# Patient Record
Sex: Female | Born: 1970 | Race: White | Hispanic: No | Marital: Married | Smoking: Current every day smoker
Health system: Southern US, Community
[De-identification: ages and names within clinical notes are randomized; demographics above are authoritative.]

## PROBLEM LIST (undated history)

## (undated) DIAGNOSIS — Z952 Presence of prosthetic heart valve: Secondary | ICD-10-CM

## (undated) DIAGNOSIS — Z8679 Personal history of other diseases of the circulatory system: Secondary | ICD-10-CM

## (undated) DIAGNOSIS — E785 Hyperlipidemia, unspecified: Secondary | ICD-10-CM

## (undated) DIAGNOSIS — F419 Anxiety disorder, unspecified: Secondary | ICD-10-CM

## (undated) HISTORY — PX: CARDIAC VALVE SURGERY: SHX40

---

## 2016-07-25 ENCOUNTER — Inpatient Hospital Stay
Admission: EM | Admit: 2016-07-25 | Discharge: 2016-08-01 | DRG: 897 | Disposition: A | Discharge status: Home / Self Care | Payer: Self-pay | Attending: Internal Medicine | Admitting: Internal Medicine

## 2016-07-25 ENCOUNTER — Emergency Department: Payer: Self-pay

## 2016-07-25 ENCOUNTER — Encounter: Payer: Self-pay | Admitting: Emergency Medicine

## 2016-07-25 DIAGNOSIS — F13239 Sedative, hypnotic or anxiolytic dependence with withdrawal, unspecified: Principal | ICD-10-CM | POA: Diagnosis present

## 2016-07-25 DIAGNOSIS — Z7901 Long term (current) use of anticoagulants: Secondary | ICD-10-CM

## 2016-07-25 DIAGNOSIS — Z8673 Personal history of transient ischemic attack (TIA), and cerebral infarction without residual deficits: Secondary | ICD-10-CM

## 2016-07-25 DIAGNOSIS — E876 Hypokalemia: Secondary | ICD-10-CM | POA: Diagnosis present

## 2016-07-25 DIAGNOSIS — F1110 Opioid abuse, uncomplicated: Secondary | ICD-10-CM | POA: Diagnosis present

## 2016-07-25 DIAGNOSIS — Z833 Family history of diabetes mellitus: Secondary | ICD-10-CM

## 2016-07-25 DIAGNOSIS — I639 Cerebral infarction, unspecified: Secondary | ICD-10-CM

## 2016-07-25 DIAGNOSIS — G9389 Other specified disorders of brain: Secondary | ICD-10-CM

## 2016-07-25 DIAGNOSIS — R410 Disorientation, unspecified: Secondary | ICD-10-CM | POA: Diagnosis present

## 2016-07-25 DIAGNOSIS — G939 Disorder of brain, unspecified: Secondary | ICD-10-CM | POA: Diagnosis present

## 2016-07-25 DIAGNOSIS — Z79899 Other long term (current) drug therapy: Secondary | ICD-10-CM

## 2016-07-25 DIAGNOSIS — F17210 Nicotine dependence, cigarettes, uncomplicated: Secondary | ICD-10-CM | POA: Diagnosis present

## 2016-07-25 DIAGNOSIS — Z952 Presence of prosthetic heart valve: Secondary | ICD-10-CM

## 2016-07-25 DIAGNOSIS — F419 Anxiety disorder, unspecified: Secondary | ICD-10-CM | POA: Diagnosis present

## 2016-07-25 DIAGNOSIS — Z8249 Family history of ischemic heart disease and other diseases of the circulatory system: Secondary | ICD-10-CM

## 2016-07-25 DIAGNOSIS — R451 Restlessness and agitation: Secondary | ICD-10-CM | POA: Diagnosis present

## 2016-07-25 DIAGNOSIS — R4182 Altered mental status, unspecified: Secondary | ICD-10-CM

## 2016-07-25 DIAGNOSIS — Z9114 Patient's other noncompliance with medication regimen: Secondary | ICD-10-CM

## 2016-07-25 DIAGNOSIS — G8929 Other chronic pain: Secondary | ICD-10-CM | POA: Diagnosis present

## 2016-07-25 DIAGNOSIS — Z8679 Personal history of other diseases of the circulatory system: Secondary | ICD-10-CM

## 2016-07-25 HISTORY — DX: Hyperlipidemia, unspecified: E78.5

## 2016-07-25 HISTORY — DX: Anxiety disorder, unspecified: F41.9

## 2016-07-25 HISTORY — DX: Presence of prosthetic heart valve: Z95.2

## 2016-07-25 HISTORY — DX: Personal history of other diseases of the circulatory system: Z86.79

## 2016-07-25 LAB — URINE DRUG SCREEN, QUALITATIVE (ARMC ONLY)
Amphetamines, Ur Screen: NOT DETECTED
Barbiturates, Ur Screen: NOT DETECTED
Benzodiazepine, Ur Scrn: POSITIVE — AB
Cannabinoid 50 Ng, Ur ~~LOC~~: NOT DETECTED
Cocaine Metabolite,Ur ~~LOC~~: NOT DETECTED
MDMA (Ecstasy)Ur Screen: NOT DETECTED
Methadone Scn, Ur: NOT DETECTED
Opiate, Ur Screen: NOT DETECTED
Phencyclidine (PCP) Ur S: NOT DETECTED
Tricyclic, Ur Screen: NOT DETECTED

## 2016-07-25 LAB — CBC WITH DIFFERENTIAL/PLATELET
BASOS ABS: 0 10*3/uL (ref 0–0.1)
BASOS PCT: 1 %
EOS ABS: 0.1 10*3/uL (ref 0–0.7)
Eosinophils Relative: 1 %
HEMATOCRIT: 32.3 % — AB (ref 35.0–47.0)
HEMOGLOBIN: 10.6 g/dL — AB (ref 12.0–16.0)
Lymphocytes Relative: 37 %
Lymphs Abs: 2.7 10*3/uL (ref 1.0–3.6)
MCH: 25.8 pg — ABNORMAL LOW (ref 26.0–34.0)
MCHC: 32.7 g/dL (ref 32.0–36.0)
MCV: 78.9 fL — ABNORMAL LOW (ref 80.0–100.0)
Monocytes Absolute: 0.7 10*3/uL (ref 0.2–0.9)
Monocytes Relative: 10 %
NEUTROS PCT: 53 %
Neutro Abs: 3.9 10*3/uL (ref 1.4–6.5)
Platelets: 325 10*3/uL (ref 150–440)
RBC: 4.1 MIL/uL (ref 3.80–5.20)
RDW: 18.8 % — AB (ref 11.5–14.5)
WBC: 7.5 10*3/uL (ref 3.6–11.0)

## 2016-07-25 LAB — POCT PREGNANCY, URINE: PREG TEST UR: NEGATIVE

## 2016-07-25 LAB — PROTIME-INR
INR: 1.82
PROTHROMBIN TIME: 21.3 s — AB (ref 11.4–15.2)

## 2016-07-25 LAB — LIPASE, BLOOD: LIPASE: 65 U/L — AB (ref 11–51)

## 2016-07-25 LAB — URINALYSIS COMPLETE WITH MICROSCOPIC (ARMC ONLY)
Bilirubin Urine: NEGATIVE
Glucose, UA: NEGATIVE mg/dL
Hgb urine dipstick: NEGATIVE
Ketones, ur: NEGATIVE mg/dL
Leukocytes, UA: NEGATIVE
Nitrite: NEGATIVE
Protein, ur: NEGATIVE mg/dL
RBC / HPF: NONE SEEN RBC/hpf (ref 0–5)
Specific Gravity, Urine: 1.006 (ref 1.005–1.030)
pH: 8 (ref 5.0–8.0)

## 2016-07-25 LAB — SALICYLATE LEVEL

## 2016-07-25 LAB — LACTIC ACID, PLASMA
LACTIC ACID, VENOUS: 2 mmol/L — AB (ref 0.5–1.9)
LACTIC ACID, VENOUS: 1.9 mmol/L (ref 0.5–1.9)

## 2016-07-25 LAB — TROPONIN I: TROPONIN I: 0.06 ng/mL — AB (ref <0.03)

## 2016-07-25 LAB — COMPREHENSIVE METABOLIC PANEL
ALK PHOS: 109 U/L (ref 38–126)
ALT: 28 U/L (ref 14–54)
ANION GAP: 10 (ref 5–15)
AST: 52 U/L — ABNORMAL HIGH (ref 15–41)
Albumin: 4.2 g/dL (ref 3.5–5.0)
BILIRUBIN TOTAL: 0.5 mg/dL (ref 0.3–1.2)
BUN: 19 mg/dL (ref 6–20)
CALCIUM: 9.6 mg/dL (ref 8.9–10.3)
CO2: 23 mmol/L (ref 22–32)
Chloride: 103 mmol/L (ref 101–111)
Creatinine, Ser: 0.84 mg/dL (ref 0.44–1.00)
GFR calc non Af Amer: >60 mL/min (ref >60)
Glucose, Bld: 92 mg/dL (ref 65–99)
Potassium: 3.8 mmol/L (ref 3.5–5.1)
SODIUM: 136 mmol/L (ref 135–145)
TOTAL PROTEIN: 8.2 g/dL — AB (ref 6.5–8.1)

## 2016-07-25 LAB — AMMONIA: Ammonia: 13 umol/L (ref 9–35)

## 2016-07-25 LAB — ETHANOL: Alcohol, Ethyl (B): <5 mg/dL

## 2016-07-25 LAB — ACETAMINOPHEN LEVEL: Acetaminophen (Tylenol), Serum: <10 ug/mL — ABNORMAL LOW (ref 10–30)

## 2016-07-25 MED ORDER — LORAZEPAM 2 MG/ML IJ SOLN: 0.0000 mg | Freq: Two times a day (BID) | INTRAMUSCULAR | Status: DC

## 2016-07-25 MED ORDER — PIPERACILLIN-TAZOBACTAM 4.5 G IVPB
4.5000 g | Freq: Three times a day (TID) | INTRAVENOUS | Status: DC
  Filled 2016-07-25 (×3): qty 100

## 2016-07-25 MED ORDER — MORPHINE SULFATE (PF) 2 MG/ML IV SOLN
2.0000 mg | INTRAVENOUS | Status: DC | PRN
  Administered 2016-07-26 – 2016-08-01 (×18): 2 mg via INTRAVENOUS
  Filled 2016-07-25 (×18): qty 1

## 2016-07-25 MED ORDER — VITAMIN B-1 100 MG PO TABS
100.0000 mg | ORAL_TABLET | Freq: Every day | ORAL | Status: DC
  Administered 2016-07-26 – 2016-08-01 (×6): 100 mg via ORAL
  Filled 2016-07-25 (×6): qty 1

## 2016-07-25 MED ORDER — HEPARIN (PORCINE) IN NACL 100-0.45 UNIT/ML-% IJ SOLN
1100.0000 [IU]/h | INTRAMUSCULAR | Status: DC
  Administered 2016-07-26: 900 [IU]/h via INTRAVENOUS
  Administered 2016-07-27: 1100 [IU]/h via INTRAVENOUS
  Filled 2016-07-25 (×2): qty 250

## 2016-07-25 MED ORDER — SODIUM CHLORIDE 0.9% FLUSH
3.0000 mL | Freq: Two times a day (BID) | INTRAVENOUS | Status: DC
  Administered 2016-07-25 – 2016-07-30 (×8): 3 mL via INTRAVENOUS

## 2016-07-25 MED ORDER — PNEUMOCOCCAL VAC POLYVALENT 25 MCG/0.5ML IJ INJ
0.5000 mL | INJECTION | INTRAMUSCULAR | Status: AC
  Administered 2016-07-26: 0.5 mL via INTRAMUSCULAR
  Filled 2016-07-25: qty 0.5

## 2016-07-25 MED ORDER — FLUCONAZOLE IN SODIUM CHLORIDE 400-0.9 MG/200ML-% IV SOLN
800.0000 mg | Freq: Once | INTRAVENOUS | Status: AC
  Administered 2016-07-26: 800 mg via INTRAVENOUS
  Filled 2016-07-25: qty 400

## 2016-07-25 MED ORDER — DULOXETINE HCL 30 MG PO CPEP
30.0000 mg | ORAL_CAPSULE | Freq: Every day | ORAL | Status: DC
  Administered 2016-07-26 – 2016-08-01 (×6): 30 mg via ORAL
  Filled 2016-07-25 (×6): qty 1

## 2016-07-25 MED ORDER — ONDANSETRON HCL 4 MG/2ML IJ SOLN: 4.0000 mg | Freq: Four times a day (QID) | INTRAMUSCULAR | Status: DC | PRN

## 2016-07-25 MED ORDER — ONDANSETRON HCL 4 MG PO TABS: 4.0000 mg | ORAL_TABLET | Freq: Four times a day (QID) | ORAL | Status: DC | PRN

## 2016-07-25 MED ORDER — HEPARIN BOLUS VIA INFUSION
3000.0000 [IU] | Freq: Once | INTRAVENOUS | Status: AC
  Administered 2016-07-26: 02:00:00 3000 [IU] via INTRAVENOUS
  Filled 2016-07-25: qty 3000

## 2016-07-25 MED ORDER — LORAZEPAM 2 MG/ML IJ SOLN
0.0000 mg | Freq: Four times a day (QID) | INTRAMUSCULAR | Status: DC
  Administered 2016-07-26: 2 mg via INTRAVENOUS
  Administered 2016-07-26 (×2): 1 mg via INTRAVENOUS
  Filled 2016-07-25: qty 1

## 2016-07-25 MED ORDER — ADULT MULTIVITAMIN W/MINERALS CH
1.0000 | ORAL_TABLET | Freq: Every day | ORAL | Status: DC
Start: 2016-07-26 — End: 2016-08-01
  Administered 2016-07-26 – 2016-08-01 (×6): 1 via ORAL
  Filled 2016-07-25 (×6): qty 1

## 2016-07-25 MED ORDER — LORAZEPAM 1 MG PO TABS: 1.0000 mg | ORAL_TABLET | Freq: Four times a day (QID) | ORAL | Status: DC | PRN

## 2016-07-25 MED ORDER — VANCOMYCIN HCL IN DEXTROSE 1-5 GM/200ML-% IV SOLN
1000.0000 mg | Freq: Once | INTRAVENOUS | Status: AC
  Administered 2016-07-26: 02:00:00 1000 mg via INTRAVENOUS
  Filled 2016-07-25: qty 200

## 2016-07-25 MED ORDER — MORPHINE SULFATE (PF) 2 MG/ML IV SOLN
2.0000 mg | Freq: Once | INTRAVENOUS | Status: AC
  Administered 2016-07-25: 2 mg via INTRAVENOUS
  Filled 2016-07-25: qty 1

## 2016-07-25 MED ORDER — ACETAMINOPHEN 650 MG RE SUPP: 650.0000 mg | Freq: Four times a day (QID) | RECTAL | Status: DC | PRN

## 2016-07-25 MED ORDER — ACETAMINOPHEN 325 MG PO TABS
650.0000 mg | ORAL_TABLET | Freq: Four times a day (QID) | ORAL | Status: DC | PRN
  Administered 2016-07-27 – 2016-07-28 (×2): 650 mg via ORAL
  Filled 2016-07-25 (×2): qty 2

## 2016-07-25 MED ORDER — SODIUM CHLORIDE 0.9 % IV SOLN
INTRAVENOUS | Status: DC
  Administered 2016-07-25: via INTRAVENOUS

## 2016-07-25 MED ORDER — LORAZEPAM 2 MG/ML IJ SOLN
1.0000 mg | Freq: Once | INTRAMUSCULAR | Status: AC
  Administered 2016-07-25: 1 mg via INTRAVENOUS
  Filled 2016-07-25: qty 1

## 2016-07-25 MED ORDER — THIAMINE HCL 100 MG/ML IJ SOLN
100.0000 mg | Freq: Every day | INTRAMUSCULAR | Status: DC
  Administered 2016-07-27: 100 mg via INTRAVENOUS
  Filled 2016-07-25: qty 2

## 2016-07-25 MED ORDER — LORAZEPAM 2 MG/ML IJ SOLN
1.0000 mg | Freq: Four times a day (QID) | INTRAMUSCULAR | Status: DC | PRN
  Filled 2016-07-25 (×2): qty 1

## 2016-07-25 MED ORDER — FOLIC ACID 1 MG PO TABS
1.0000 mg | ORAL_TABLET | Freq: Every day | ORAL | Status: DC
  Administered 2016-07-26 – 2016-08-01 (×6): 1 mg via ORAL
  Filled 2016-07-25 (×6): qty 1

## 2016-07-25 NOTE — ED Notes (Signed)
Put pt on bed pan

## 2016-07-25 NOTE — ED Triage Notes (Signed)
Pt had recent mitral valve replacement and started on coumadin. Pt's husband reports pt was recently started on antibiotic and pt has had increased confusion , hallucinations, and slightly combative. Pt reports vision changes, able to answer some questions, has involuntary muscle jerking.

## 2016-07-25 NOTE — Consult Note (Signed)
ID E note Spoke with ED and admitting hospitalist.  Unclear etiology of her AMS but she appears to be off of her fluconazole. Last saw ID July 25th- see note below copied from them.   Rec Repeat cxs Start vanco, ceftriaxone in case she has resume injecting and has a new infection. Restart fluconazole Consult Neuro       July 25th ID note- [facility] [place] Rachel Morrison presents for follow-up. 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 Since I had last seen her in clinic she was briefly admitted to the hospital due to a soft tissue infection of her sternal incision it cultured positive for MRSA. She was treated with clindamycin. The incision has now completely healed with no tenderness or drainage. She was seen by cardiothoracic surgery and it was felt that there was no sternal infection. I agree with this.  As of today she informs me that she is moving to [place]. [facility] has arranged for ongoing follow-up with Dr. Juliann Pares. I will send my note from today to him.  She has had difficulties with Coumadin, at times with elevated INR and at times with subtherapeutic. We certainly note the drug interaction between fluconazole and Coumadin, but ongoing fluconazole treatment is essential for her health in life. I would expect that when she has a titrated Coumadin dose (which we would expect to be a low-dose given drug interactions) this should be without difficulty.  She informs me that she has not used any injecting drugs. She has old track marks on her arms. In my opinion, there should be ongoing concern for risk of polysubstance abuse.  During her initial hospitalization in May she had persistent candidemia with Candida parapsilosis. She was initially treated with Micafungin and did then require mitral valve  replacement. During her initial illness she had altered sensation on the left side of her body. She had an MRI of the brain that was felt to not show any stroke. She was seen by Neurology. She had an MRI of her cervical spine which also did not show any abnormalities. 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 MRI of the lumbar spine at that point showed some L3-L4 involvement and also involvement of the left sacral ala. Also during that time there was some evidence of left distal popliteal artery occlusion with distal reconstitution. 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  As of today she looks fairly comfortable. She still feels that she has some altered sensation. MRI is not possible with her mechanical valve. She is relatively improved compared to before. She feels that she has some altered peripheral vision on her left eye. I have advised her that she should seek ophthalmology evaluation. She will need ongoing infectious diseases follow-up as well. She is known to [place] at this point I have suggested ongoing follow-up with me. If there are difficulties with travel we could facilitate follow-up with infectious diseases in [place] or at [place] [facility] [place].  She has had an extremely complex disease process. I am readily available to discuss with any of her future treating physicians. We plan on continuing fluconazole for at least 6 months, if not lifelong. It should be noted that there was not evidence of annular abscess at the time of her valve replacement, but she did have a large vegetation and had  complications of embolic events and metastatic infection, as described above. She still has back pain. She had chronic back pain even before the infection. In the future we would consider repeat inflammatory markers and CT scan of the spine to evaluate for the status of the previously identified left sacral ala infection.

## 2016-07-25 NOTE — H&P (Signed)
[facility] [place] [facility]   PATIENT NAME: Rachel Morrison Willmann    MR#:  [id number]  DATE OF BIRTH:  [birth year]  DATE OF ADMISSION:  07/25/2016  PRIMARY CARE PHYSICIAN: No PCP Per Patient   REQUESTING/REFERRING PHYSICIAN: Pershing ProudSchaevitz, MD  CHIEF COMPLAINT:   Chief Complaint  Patient presents with  . Altered Mental Status    HISTORY OF PRESENT ILLNESS:  Rachel Morrison Anand  is a 45 y.o. female who presents with Altered mental status and hallucinations. She was brought in by her significant other states that she tried to jump out of a car today because she saw some in the back seat that she thought was telling him to kill her. Patient states that she has had some other hallucinations recently. Sometimes she can tolerate hallucinations and other times not. 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 She's had difficult time maintaining therapeutic INR with several episodes of subtherapeutic INR. She denies any recent infectious symptoms. 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 Hospitalists were called for admission and further evaluation  PAST MEDICAL HISTORY:   Past Medical History:  Diagnosis Date  . Anxiety   . H/O endocarditis   . H/O mitral valve replacement   . HLD (hyperlipidemia)     PAST SURGICAL HISTORY:   Past Surgical History:  Procedure Laterality Date  . CARDIAC VALVE SURGERY      SOCIAL HISTORY:   Social History  Substance Use Topics  . Smoking status: Not on file  . Smokeless tobacco: Not on file  . Alcohol use Not on file    FAMILY HISTORY:   Family History  Problem Relation Age of Onset  . Diabetes Mother   . Sleep apnea Mother   . Heart attack Father    . Hypertension Father     DRUG ALLERGIES:  No Known Allergies  MEDICATIONS AT HOME:   Prior to Admission medications   Medication Sig Start Date End Date Taking? Authorizing Provider  ALPRAZolam Prudy Feeler(XANAX) 1 MG tablet Take 1 tablet by mouth at bedtime as needed. 07/01/16  Yes Historical Provider, MD  enoxaparin (LOVENOX) 60 MG/0.6ML injection Inject into the skin. 06/18/16  Yes Historical Provider, MD  DULoxetine (CYMBALTA) 30 MG capsule Take 1 capsule by mouth daily.    Historical Provider, MD  warfarin (COUMADIN) 2.5 MG tablet Take 1 tablet by mouth daily.    Historical Provider, MD    REVIEW OF SYSTEMS:  Review of Systems  Constitutional: Negative for chills, fever, malaise/fatigue and weight loss.  HENT: Negative for ear pain, hearing loss and tinnitus.   Eyes: Negative for blurred vision, double vision, pain and redness.  Respiratory: Negative for cough, hemoptysis and shortness of breath.   Cardiovascular: Negative for chest pain, palpitations, orthopnea and leg swelling.  Gastrointestinal: Negative for abdominal pain, constipation, diarrhea, nausea and vomiting.  Genitourinary: Negative for dysuria, frequency and hematuria.  Musculoskeletal: Negative for back pain, joint pain and neck pain.  Skin:       No acne, rash, or lesions  Neurological: Negative for dizziness, tremors, focal weakness and weakness.  Endo/Heme/Allergies: Negative for polydipsia. Does not bruise/bleed easily.  Psychiatric/Behavioral: Positive for hallucinations and memory loss. Negative for depression. The patient is not nervous/anxious  and does not have insomnia.        Confusion     VITAL SIGNS:   Vitals:   07/25/16 1758  BP: (!) 141/95  Pulse: 95  Resp: 16  Temp: 98 F (36.7 C)  TempSrc: Oral  SpO2: 95%  Weight: 52.6 kg (116 lb)   Wt Readings from Last 3 Encounters:  07/25/16 52.6 kg (116 lb)    PHYSICAL EXAMINATION:  Physical Exam  Vitals reviewed. Constitutional: She appears  well-developed and well-nourished. No distress.  HENT:  Head: Normocephalic and atraumatic.  Mouth/Throat: Oropharynx is clear and moist.  Eyes: Conjunctivae and EOM are normal. Pupils are equal, round, and reactive to light. No scleral icterus.  Neck: Normal range of motion. Neck supple. No JVD present. No thyromegaly present.  Cardiovascular: Normal rate, regular rhythm and intact distal pulses.  Exam reveals no gallop and no friction rub.   No murmur heard. Respiratory: Effort normal and breath sounds normal. No respiratory distress. She has no wheezes. She has no rales.  GI: Soft. Bowel sounds are normal. She exhibits no distension. There is no tenderness.  Musculoskeletal: Normal range of motion. She exhibits no edema.  No arthritis, no gout  Lymphadenopathy:    She has no cervical adenopathy.  Neurological: She is alert. No cranial nerve deficit.  Patient is oriented on questioning, but the course of her speech is oftentimes very distracted, tangential, or outright confused. She also has difficulty remembering some things, including things that happened today. No dysarthria, no aphasia  Skin: Skin is warm and dry. No rash noted. No erythema.  Psychiatric: She has a normal mood and affect.  Significant other reports erratic behavior, see history of present illness    LABORATORY PANEL:   CBC  Recent Labs Lab 07/25/16 1804  WBC 7.5  HGB 10.6*  HCT 32.3*  PLT 325   ------------------------------------------------------------------------------------------------------------------  Chemistries   Recent Labs Lab 07/25/16 1820  NA 136  K 3.8  CL 103  CO2 23  GLUCOSE 92  BUN 19  CREATININE 0.84  CALCIUM 9.6  AST 52*  ALT 28  ALKPHOS 109  BILITOT 0.5   ------------------------------------------------------------------------------------------------------------------  Cardiac Enzymes  Recent Labs Lab 07/25/16 1804  TROPONINI 0.06*    ------------------------------------------------------------------------------------------------------------------  RADIOLOGY:  Dg Chest 1 View  Result Date: 07/25/2016 CLINICAL DATA:  Altered mental status. Recent mitral valve replacement. EXAM: CHEST 1 VIEW COMPARISON:  None. FINDINGS: Sternotomy wires appear aligned and intact. Mitral valve prosthesis is in place. Normal heart size. Normal mediastinal contour. No pneumothorax. No pleural effusion. Lungs appear clear, with no acute consolidative airspace disease and no pulmonary edema. IMPRESSION: No active disease. Electronically Signed   By: Delbert Phenix M.D.   On: 07/25/2016 18:40   Ct Head Wo Contrast  Result Date: 07/25/2016 CLINICAL DATA:  Altered mental status. Anticoagulated on Coumadin for recent mitral valve replacement. Confusion and hallucinations. EXAM: CT HEAD WITHOUT CONTRAST TECHNIQUE: Contiguous axial images were obtained from the base of the skull through the vertex without intravenous contrast. COMPARISON:  None. FINDINGS: There is a large focus of encephalomalacia in the medial right occipital lobe with associated ex vacuo dilatation of the occipital horn of the right lateral ventricle. There is a small focus of encephalomalacia in the left superior cerebellar hemisphere. There is a hypoechoic focus in the right thalamus. No evidence of parenchymal hemorrhage or extra-axial fluid collection. No mass lesion, mass effect, or midline shift. Otherwise no ventriculomegaly. The visualized paranasal sinuses are essentially clear. The  mastoid air cells are unopacified. No evidence of calvarial fracture. IMPRESSION: 1. Large focus of encephalomalacia in the medial right occipital lobe with ex vacuo dilatation of the occipital horn of the right lateral ventricle, most suggestive of a chronic infarct. 2. Small focus of encephalomalacia in the left superior cerebellar hemisphere, most suggestive of a late subacute to chronic infarct. 3.  Hypoechoic focus in the right thalamus, suggesting an infarct of uncertain chronicity, probably subacute to chronic. Correlate with brain MRI as clinically warranted. 4. No acute intracranial hemorrhage. No significant mass effect or midline shift. Electronically Signed   By: Delbert Phenix M.D.   On: 07/25/2016 18:50    EKG:   Orders placed or performed during the hospital encounter of 07/25/16  . EKG 12-Lead  . EKG 12-Lead    IMPRESSION AND PLAN:  Principal Problem:   Altered mental status - suspect potentially related to the cause of her brain lesions. High suspicion for embolic phenomenon, very possibly related to subtherapeutic INR with Mechanical heart valve. We will have her on heparin tonight for anticoagulation, and consult cardiology. We have also consulted neurology. We started her on antibiotics for endocarditis including antifungal and consulted ID. She may very well end up needing a psychiatry consult as well depending on the results of the above workup. Active Problems:   Brain lesion - strong suspicion for possible embolic phenomenon, workup as above.   Anxiety - patient had benzo positive on her urine drug screen and apparently was on benzos, we have her on CIWA for any withdrawal phenomenon   H/O endocarditis - with mechanical mitral valve, see workup as above  All the records are reviewed and case discussed with ED provider. Management plans discussed with the patient and/or family.  DVT PROPHYLAXIS: Systemic anticoagulation  GI PROPHYLAXIS: None  ADMISSION STATUS: Inpatient  CODE STATUS: Full Code Status History    This patient does not have a recorded code status. Please follow your organizational policy for patients in this situation.      TOTAL TIME TAKING CARE OF THIS PATIENT: 45 minutes.    Graviel Payeur FIELDING 07/25/2016, 8:58 PM  [facility]  Office  [phone]  CC: Primary care physician; No PCP Per Patient

## 2016-07-25 NOTE — ED Provider Notes (Signed)
[facility] Emergency Department Provider Note   ____________________________________________   First MD Initiated Contact with Patient 07/25/16 1809     (approximate)  I have reviewed the triage vital signs and the nursing notes.   HISTORY  Chief Complaint Altered Mental Status    HPI Rachel Morrison is a 45 y.o. female who is presenting to the emergency department today with 2-3 days of altered mental status. She was brought in by her Cyndie Chimeguyen other after the altered mental status continued to progress. The patient has a history of endocarditis with mitral valve replacement this past May in [place]. 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 She is a former IV drug user. She denies any recent IV drug use and her significant other agrees. The patient denies any alcohol use. She says that she has pain to her left side and has had chronic pain and weakness to the side ever since she was hospitalized for endocarditis. Per the patient's significant other she has been taking her Coumadin but not her other medications which include fluconazole and Xanax.  Per the patient's significant other the patient has been hallucinating and has been trying to get out of the car while it has been moving.  I reviewed her record from her previous admission in May and she was found to have possible infection abutting the L4 vertebra which the patient as well as significant other say is the cause of the left-sided pain and weakness.    Past Medical History:  Diagnosis Date  . H/O mitral valve replacement     There are no active problems to display for this patient.   No past surgical history on file.  Prior to Admission medications   Medication Sig Start Date End Date Taking? Authorizing Provider  ALPRAZolam Prudy Feeler(XANAX) 1 MG tablet Take 1 tablet by mouth at bedtime as needed. 07/01/16  Yes  Historical Provider, MD  enoxaparin (LOVENOX) 60 MG/0.6ML injection Inject into the skin. 06/18/16  Yes Historical Provider, MD  DULoxetine (CYMBALTA) 30 MG capsule Take 1 capsule by mouth daily.    Historical Provider, MD  warfarin (COUMADIN) 2.5 MG tablet Take 1 tablet by mouth daily.    Historical Provider, MD    Allergies Review of patient's allergies indicates no known allergies.  No family history on file.  Social History Social History  Substance Use Topics  . Smoking status: Not on file  . Smokeless tobacco: Not on file  . Alcohol use Not on file    Review of Systems Constitutional: No fever/chills Eyes: No visual changes. ENT: No sore throat. Cardiovascular: Denies chest pain. Respiratory: Denies shortness of breath. Gastrointestinal: No abdominal pain.  No nausea, no vomiting.  No diarrhea.  No constipation. Genitourinary: Negative for dysuria. Musculoskeletal: Negative for back pain. Skin: Negative for rash. Neurological: Negative for headaches, focal weakness or numbness.  10-point ROS otherwise negative.  However, the review of systems seems to be confounded by the patient's altered mental status. She is only able to answer with limited detail.  ____________________________________________   PHYSICAL EXAM:  VITAL SIGNS: ED Triage Vitals [07/25/16 1758]  Enc Vitals Group     BP (!) 141/95     Pulse Rate 95     Resp 16     Temp 98 F (36.7 C)     Temp Source Oral     SpO2 95 %     Weight  116 lb (52.6 kg)     Height      Head Circumference      Peak Flow      Pain Score 10     Pain Loc      Pain Edu?      Excl. in GC?     Constitutional: Alert and oriented To self only. Odd, undulating motions while lying on the stretcher. Appears to occasionally stare into space but without any seizure activity and is redirectable. Eyes: Conjunctivae are normal. PERRL. EOMI. Head: Atraumatic. Nose: No congestion/rhinnorhea. Mouth/Throat: Mucous membranes are  moist.  Oropharynx non-erythematous. Neck: No stridor.   Cardiovascular: Normal rate, regular rhythm. Mechanical valve click without any obvious murmur.  Good peripheral circulation with intact in bilateral radial as well as dorsalis pedis pulses. Respiratory: Normal respiratory effort.  No retractions. Lungs CTAB. Gastrointestinal: Soft and nontender. No distention.  Musculoskeletal: No lower extremity tenderness nor edema.  No joint effusions. Neurologic:  Normal speech and language. 4-5 strength to the left lower extremity. Otherwise no focal neurologic findings. Skin:  Skin is warm, dry and intact. No rash noted. Psychiatric: Odd affect.  ____________________________________________   LABS (all labs ordered are listed, but only abnormal results are displayed)  Labs Reviewed  CBC WITH DIFFERENTIAL/PLATELET - Abnormal; Notable for the following:       Result Value   Hemoglobin 10.6 (*)    HCT 32.3 (*)    MCV 78.9 (*)    MCH 25.8 (*)    RDW 18.8 (*)    All other components within normal limits  TROPONIN I - Abnormal; Notable for the following:    Troponin I 0.06 (*)    All other components within normal limits  PROTIME-INR - Abnormal; Notable for the following:    Prothrombin Time 21.3 (*)    All other components within normal limits  URINALYSIS COMPLETEWITH MICROSCOPIC ([facility] ONLY) - Abnormal; Notable for the following:    Color, Urine STRAW (*)    APPearance CLEAR (*)    Bacteria, UA RARE (*)    Squamous Epithelial / LPF 0-5 (*)    All other components within normal limits  LACTIC ACID, PLASMA - Abnormal; Notable for the following:    Lactic Acid, Venous 2.0 (*)    All other components within normal limits  ACETAMINOPHEN LEVEL - Abnormal; Notable for the following:    Acetaminophen (Tylenol), Serum <10 (*)    All other components within normal limits  URINE DRUG SCREEN, QUALITATIVE ([facility] ONLY) - Abnormal; Notable for the following:    Benzodiazepine, Ur Scrn POSITIVE (*)     All other components within normal limits  COMPREHENSIVE METABOLIC PANEL - Abnormal; Notable for the following:    Total Protein 8.2 (*)    AST 52 (*)    All other components within normal limits  LIPASE, BLOOD - Abnormal; Notable for the following:    Lipase 65 (*)    All other components within normal limits  CULTURE, BLOOD (ROUTINE X 2)  CULTURE, BLOOD (ROUTINE X 2)  CULTURE, BLOOD (SINGLE)  ETHANOL  AMMONIA  SALICYLATE LEVEL  LACTIC ACID, PLASMA  POC URINE PREG, ED  POCT PREGNANCY, URINE   ____________________________________________  EKG  ED ECG REPORT I, Schaevitz,  Teena Irani, the attending physician, personally viewed and interpreted this ECG.   Date: 07/25/2016  EKG Time: 2018  Rate: 91  Rhythm: normal sinus rhythm  Axis: Normal  Intervals:none  ST&T Change: T wave inversions in aVL as well  as V2. No ST elevation or depression. No previous for comparison.  ____________________________________________  RADIOLOGY  CT Head Wo Contrast (Accession [id number]) (Order [id number])  Imaging  Date: 07/25/2016 Department: [facility] EMERGENCY DEPARTMENT Released By/Authorizing: Myrna Blazer, MD (auto-released)  PACS Images   Show images for CT Head Wo Contrast  Study Result   CLINICAL DATA:  Altered mental status. Anticoagulated on Coumadin for recent mitral valve replacement. Confusion and hallucinations.  EXAM: CT HEAD WITHOUT CONTRAST  TECHNIQUE: Contiguous axial images were obtained from the base of the skull through the vertex without intravenous contrast.  COMPARISON:  None.  FINDINGS: There is a large focus of encephalomalacia in the medial right occipital lobe with associated ex vacuo dilatation of the occipital horn of the right lateral ventricle. There is a small focus of encephalomalacia in the left superior cerebellar hemisphere. There is a hypoechoic focus in the right thalamus. No evidence of parenchymal  hemorrhage or extra-axial fluid collection. No mass lesion, mass effect, or midline shift. Otherwise no ventriculomegaly.  The visualized paranasal sinuses are essentially clear. The mastoid air cells are unopacified. No evidence of calvarial fracture.  IMPRESSION: 1. Large focus of encephalomalacia in the medial right occipital lobe with ex vacuo dilatation of the occipital horn of the right lateral ventricle, most suggestive of a chronic infarct. 2. Small focus of encephalomalacia in the left superior cerebellar hemisphere, most suggestive of a late subacute to chronic infarct. 3. Hypoechoic focus in the right thalamus, suggesting an infarct of uncertain chronicity, probably subacute to chronic. Correlate with brain MRI as clinically warranted. 4. No acute intracranial hemorrhage. No significant mass effect or midline shift.   Electronically Signed   By: Delbert Phenix M.D.   On: 07/25/2016 18:50    DG Chest 1 View (Accession [id number]) (Order [id number])  Imaging  Date: 07/25/2016 Department: [facility] EMERGENCY DEPARTMENT Released By/Authorizing: Myrna Blazer, MD (auto-released)  PACS Images   Show images for DG Chest 1 View  Study Result   CLINICAL DATA:  Altered mental status. Recent mitral valve replacement.  EXAM: CHEST 1 VIEW  COMPARISON:  None.  FINDINGS: Sternotomy wires appear aligned and intact. Mitral valve prosthesis is in place. Normal heart size. Normal mediastinal contour. No pneumothorax. No pleural effusion. Lungs appear clear, with no acute consolidative airspace disease and no pulmonary edema.  IMPRESSION: No active disease.   Electronically Signed   By: Delbert Phenix M.D.   On: 07/25/2016 18:40     ____________________________________________   PROCEDURES  Procedure(s) performed:   Procedures  Critical Care performed:   ____________________________________________   INITIAL  IMPRESSION / ASSESSMENT AND PLAN / ED COURSE  Pertinent labs & imaging results that were available during my care of the patient were reviewed by me and considered in my medical decision making (see chart for details).  ----------------------------------------- 7:58 PM on 07/25/2016 -----------------------------------------  At 7:20 PM I discussed case with Dr. Sampson Goon of infectious disease who recommended starting the patient on vancomycin, Zosyn as well as sulconazole. He recommends an additional set of blood cultures to bring the toe to 3 sets prior to initiation of antibiotics. I also discussed the case as well as lab results and imaging with Dr. Lady Gary  of cardiology who recommends restarting the patient on an anticoagulant. He recommends heparin at this time.  The patient initially had been given 1 mg of Ativan IV and she appears to be more calm at this time without any further undulating motions.  However, she continues to appear to be speaking to people who are not there when I'm not speaking her directly. Furthermore, the findings on the CT are new and it is possible that a continued process of emboli to the brain is causing her altered mental status.  The patient is aware that she needs to stay for admission in the hospital. Signed out to Dr. Cherlynn Kaiser.   Clinical Course     ____________________________________________   FINAL CLINICAL IMPRESSION(S) / ED DIAGNOSES  Altered mental status. Encephalomalacia.    NEW MEDICATIONS STARTED DURING THIS VISIT:  New Prescriptions   No medications on file     Note:  This document was prepared using Dragon voice recognition software and may include unintentional dictation errors.    Myrna Blazer, MD 07/25/16 2024

## 2016-07-25 NOTE — ED Notes (Signed)
Patient husband reports that patient has been having visual hallucinations that started yesterday. Patient has not mentioned having hallucinations today. Patient husband also reports that patient has not been sleeping well for the past 3 days. Patient currently with involuntary muscle movements and back arching

## 2016-07-26 DIAGNOSIS — F1110 Opioid abuse, uncomplicated: Secondary | ICD-10-CM

## 2016-07-26 DIAGNOSIS — I639 Cerebral infarction, unspecified: Secondary | ICD-10-CM

## 2016-07-26 DIAGNOSIS — R410 Disorientation, unspecified: Secondary | ICD-10-CM

## 2016-07-26 LAB — BASIC METABOLIC PANEL
ANION GAP: 9 (ref 5–15)
BUN: 17 mg/dL (ref 6–20)
CALCIUM: 8.8 mg/dL — AB (ref 8.9–10.3)
CHLORIDE: 103 mmol/L (ref 101–111)
CO2: 24 mmol/L (ref 22–32)
Creatinine, Ser: 0.86 mg/dL (ref 0.44–1.00)
GFR calc non Af Amer: >60 mL/min (ref >60)
Glucose, Bld: 132 mg/dL — ABNORMAL HIGH (ref 65–99)
POTASSIUM: 3.2 mmol/L — AB (ref 3.5–5.1)
SODIUM: 136 mmol/L (ref 135–145)

## 2016-07-26 LAB — HEPARIN LEVEL (UNFRACTIONATED)
Heparin Unfractionated: 0.42 IU/mL (ref 0.30–0.70)
Heparin Unfractionated: 0.17 IU/mL — ABNORMAL LOW (ref 0.30–0.70)
Heparin Unfractionated: 0.53 IU/mL (ref 0.30–0.70)

## 2016-07-26 LAB — CBC
HEMATOCRIT: 29.9 % — AB (ref 35.0–47.0)
HEMOGLOBIN: 10.1 g/dL — AB (ref 12.0–16.0)
MCH: 26.2 pg (ref 26.0–34.0)
MCHC: 33.6 g/dL (ref 32.0–36.0)
MCV: 77.8 fL — ABNORMAL LOW (ref 80.0–100.0)
Platelets: 300 10*3/uL (ref 150–440)
RBC: 3.85 MIL/uL (ref 3.80–5.20)
RDW: 18.3 % — ABNORMAL HIGH (ref 11.5–14.5)
WBC: 6.2 10*3/uL (ref 3.6–11.0)

## 2016-07-26 LAB — MRSA PCR SCREENING: MRSA by PCR: NEGATIVE

## 2016-07-26 LAB — GLUCOSE, CAPILLARY: Glucose-Capillary: 107 mg/dL — ABNORMAL HIGH (ref 65–99)

## 2016-07-26 LAB — APTT: APTT: 36 s (ref 24–36)

## 2016-07-26 MED ORDER — PIPERACILLIN-TAZOBACTAM 4.5 G IVPB
4.5000 g | Freq: Three times a day (TID) | INTRAVENOUS | Status: DC
  Administered 2016-07-26 – 2016-07-28 (×8): 4.5 g via INTRAVENOUS
  Filled 2016-07-26 (×10): qty 100

## 2016-07-26 MED ORDER — FLUCONAZOLE IN SODIUM CHLORIDE 400-0.9 MG/200ML-% IV SOLN
400.0000 mg | INTRAVENOUS | Status: DC
  Administered 2016-07-27 – 2016-08-01 (×6): 400 mg via INTRAVENOUS
  Filled 2016-07-26 (×7): qty 200

## 2016-07-26 MED ORDER — HALOPERIDOL LACTATE 5 MG/ML IJ SOLN
5.0000 mg | INTRAMUSCULAR | Status: DC | PRN
  Administered 2016-07-26: 5 mg via INTRAVENOUS

## 2016-07-26 MED ORDER — LORAZEPAM 2 MG/ML IJ SOLN
2.0000 mg | INTRAMUSCULAR | Status: DC | PRN
  Administered 2016-07-27: 2 mg via INTRAVENOUS
  Filled 2016-07-26: qty 1

## 2016-07-26 MED ORDER — NICOTINE 21 MG/24HR TD PT24
21.0000 mg | MEDICATED_PATCH | Freq: Every day | TRANSDERMAL | Status: DC
  Administered 2016-07-26 – 2016-08-01 (×7): 21 mg via TRANSDERMAL
  Filled 2016-07-26 (×7): qty 1

## 2016-07-26 MED ORDER — HALOPERIDOL LACTATE 5 MG/ML IJ SOLN
2.0000 mg | Freq: Four times a day (QID) | INTRAMUSCULAR | Status: DC | PRN
  Administered 2016-07-26: 2 mg via INTRAVENOUS
  Filled 2016-07-26: qty 1

## 2016-07-26 MED ORDER — LORAZEPAM 2 MG/ML IJ SOLN
2.0000 mg | Freq: Once | INTRAMUSCULAR | Status: AC
Start: 2016-07-26 — End: 2016-07-26
  Administered 2016-07-26: 17:00:00 2 mg via INTRAVENOUS
  Filled 2016-07-26: qty 1

## 2016-07-26 MED ORDER — SODIUM CHLORIDE 0.9% FLUSH
3.0000 mL | INTRAVENOUS | Status: DC | PRN
  Administered 2016-07-26: 3 mL via INTRAVENOUS
  Administered 2016-07-26: 6 mL via INTRAVENOUS
  Administered 2016-07-26 – 2016-07-31 (×6): 3 mL via INTRAVENOUS
  Filled 2016-07-26 (×8): qty 3

## 2016-07-26 MED ORDER — HEPARIN BOLUS VIA INFUSION
1500.0000 [IU] | Freq: Once | INTRAVENOUS | Status: AC
  Administered 2016-07-26: 1500 [IU] via INTRAVENOUS
  Filled 2016-07-26: qty 1500

## 2016-07-26 MED ORDER — VANCOMYCIN HCL IN DEXTROSE 750-5 MG/150ML-% IV SOLN
750.0000 mg | Freq: Two times a day (BID) | INTRAVENOUS | Status: DC
  Administered 2016-07-26 – 2016-07-28 (×5): 750 mg via INTRAVENOUS
  Filled 2016-07-26 (×6): qty 150

## 2016-07-26 MED ORDER — HALOPERIDOL LACTATE 5 MG/ML IJ SOLN
5.0000 mg | INTRAMUSCULAR | Status: AC
  Administered 2016-07-26: 5 mg via INTRAVENOUS
  Filled 2016-07-26: qty 1

## 2016-07-26 MED ORDER — DEXMEDETOMIDINE HCL IN NACL 200 MCG/50ML IV SOLN: 0.4000 ug/kg/h | INTRAVENOUS | Status: DC

## 2016-07-26 MED ORDER — HALOPERIDOL LACTATE 5 MG/ML IJ SOLN
5.0000 mg | INTRAMUSCULAR | Status: DC | PRN
  Administered 2016-07-26: 5 mg via INTRAVENOUS
  Filled 2016-07-26 (×2): qty 1

## 2016-07-26 MED ORDER — SODIUM CHLORIDE 0.9% FLUSH
3.0000 mL | Freq: Two times a day (BID) | INTRAVENOUS | Status: DC
  Administered 2016-07-26 – 2016-07-30 (×10): 3 mL via INTRAVENOUS

## 2016-07-26 MED ORDER — DEXMEDETOMIDINE HCL IN NACL 400 MCG/100ML IV SOLN
0.4000 ug/kg/h | INTRAVENOUS | Status: DC
  Administered 2016-07-27 (×2): 0.7 ug/kg/h via INTRAVENOUS
  Filled 2016-07-26 (×2): qty 100

## 2016-07-26 NOTE — Progress Notes (Signed)
TC to Dr. Eliane DecreeS. Patel that pt's confusion, visual hallucination, agitation and restless not improved with Haldol 5 mg IV after second dose. Sitter for safety placed. Husband remains at bedside who is exhausted from supporting pt appropriately. Staffed with Dr. Toni Amendlapacs with new order received.

## 2016-07-26 NOTE — Consult Note (Signed)
  Psychiatry: Follow-up consult note. I came back to check on the patient several hours later to see what effect the haloperidol had had. On repeat evaluation I found the patient still in bed with her boyfriend nearby. There is now a safety sittRecruitment consultanter in the room as well. On this occasion the patient was able to engage in some conversation with me. She was oriented to being in the hospital. She was not fully able to understand why she was here but she was also not completely delirious. She was able to stay on topic served most questions reasonably. At the same time she was still having visual hallucinations intermittently, she was physically very agitated, she frequently went off topic and talked about wanting to leave the hospital.  Nursing reports that after 2 doses of 5 mg of haloperidol each they do not think she is any better. Evaluation after a few hours she does seem better than she was when I saw her earlier today but I understand that this is just a snap shot and she is probably still continuing to wax and wane.  I spoke with Dr. Allena KatzPatel. Obviously we all agree that the patient needs to be sedated and calm down for her own safety as well as for the sake of completing the workup. Obviously we also do not want over sedate her to the point that it affects her breathing or causes a another medical problem to arise. Patient's agitation level is high enough that it is placing a significant burden on the nursing staff.  For now I am replacing my Haldol order with Haldol 5 mg IV every 4 hours as needed. I am also restarting a when necessary order of Ativan 2 mg every 4 hours with a note dependence suggesting that the 2 medicines not be given simultaneously but separated by 30 minutes.  If this amount of medication is not adequate to keep the patient safe and managed my next suggestion would eat to consider moving her to the intensive care unit and starting her on Precedex or a similar medication drip to treat  delirium.  I will continue to follow-up tomorrow.

## 2016-07-26 NOTE — Consult Note (Signed)
[facility]  CARDIOLOGY CONSULT NOTE  Patient ID: Rachel Morrison: [id number] Morrison: [birth year] 45 y.o.  Admit date: 07/25/2016 Referring Physician Dr. Allena KatzPatel Primary Physician   Primary Cardiologist Dr. Juliann Paresallwood Reason for Consultation altered mental status  HPI: Patient is a 45 year old female with history of polysubstance abuse and IV drug abuse who recently moved here from [place]. She apparently had a complicated candida endocarditis which resulted in replacement of her mitral valve in [place] with a mechanical prosthesis. She had left sided weakness during that time and brain MRI was not felt to show a stroke. There was some suggestion of splenic infarct as well as L3-4 spinal involvement. From notes from her previous workup there was not felt to be a abscess. Patient has a [employer] mechanical mitral valve. Patient has been extremely difficult to follow due to noncompliance. She apparently has been noncompliant with all of her medications. On presentation to our ER last night with altered mental status, her INR was subtherapeutic. Patient states she has been off of all of her medications including warfarin, Diflucan, Lovenox and denies IV drug use although does have track marks on her arms. These do not appear to be extremely fresh but concern over possible continued substance abuse needs to be raised. Reviewing reports from [place] shows that her INR has been difficult to manage there is well partially due to noncompliance. Patient is a candidate for MRI secondary to mechanical valve. She has been admitted and placed on heparin And placed on IV antibiotics including fluconazole, vancomycin. She is currently afebrile and white blood count is normal. She is somewhat anemic with hemoglobin of 10.1. Potassium is low at 3.2. INR was 1.82 on presentation. Renal function is normal with a GFR greater than 60. Urine drug screen was normal. Blood cultures  are pending. She denies chest pain or shortness of breath. She appears to answer most questions appropriately but does show some signs of confusion and is of limited orientation to time and place. Review of Systems  HENT: Negative.   Eyes: Positive for blurred vision.  Respiratory: Negative.   Cardiovascular: Negative.   Gastrointestinal: Negative.   Genitourinary: Negative.   Musculoskeletal: Negative.   Skin: Negative.   Neurological: Positive for focal weakness and weakness.  Endo/Heme/Allergies: Negative.   Psychiatric/Behavioral: Positive for memory loss.    Past Medical History:  Diagnosis Date  . Anxiety   . H/O endocarditis   . H/O mitral valve replacement   . HLD (hyperlipidemia)     Family History  Problem Relation Age of Onset  . Diabetes Mother   . Sleep apnea Mother   . Heart attack Father   . Hypertension Father     Social History   Social History  . Marital status: Married    Spouse name: N/A  . Number of children: N/A  . Years of education: N/A   Occupational History  . Not on file.   Social History Main Topics  . Smoking status: Current Every Day Smoker    Packs/day: 1.50    Years: 30.00    Types: Cigarettes  . Smokeless tobacco: Not on file  . Alcohol use Not on file  . Drug use: Unknown  . Sexual activity: Not on file   Other Topics Concern  . Not on file   Social History Narrative  . No narrative on file    Past Surgical History:  Procedure Laterality Date  . CARDIAC VALVE SURGERY  Prescriptions Prior to Admission  Medication Sig Dispense Refill Last Dose  . ALPRAZolam (XANAX) 1 MG tablet Take 1 tablet by mouth at bedtime as needed.     . enoxaparin (LOVENOX) 60 MG/0.6ML injection Inject into the skin.     . DULoxetine (CYMBALTA) 30 MG capsule Take 1 capsule by mouth daily.     Marland Kitchen warfarin (COUMADIN) 2.5 MG tablet Take 1 tablet by mouth daily.       Physical Exam: Blood pressure 112/76, pulse 90, temperature 98.2 F (36.8  C), temperature source Oral, resp. rate 20, height  (1.626 m), weight 52.1 kg (114 lb 14.4 oz), last menstrual period 07/09/2016, SpO2 100 %.   Wt Readings from Last 1 Encounters:  07/25/16 52.1 kg (114 lb 14.4 oz)     General appearance: cooperative and slowed mentation Head: Normocephalic, without obvious abnormality, atraumatic Resp: clear to auscultation bilaterally Cardio: regular rhythm with mechanical heart sounds GI: soft, non-tender; bowel sounds normal; no masses,  no organomegaly Extremities: extremities normal, atraumatic, no cyanosis or edema Neurologic: Mental status: alertness: alert, orientation: person Sensory: reduced tactile sense left arm  Labs:   Lab Results  Component Value Date   WBC 6.2 07/26/2016   HGB 10.1 (L) 07/26/2016   HCT 29.9 (L) 07/26/2016   MCV 77.8 (L) 07/26/2016   PLT 300 07/26/2016    Recent Labs Lab 07/25/16 1820 07/26/16 0037  NA 136 136  K 3.8 3.2*  CL 103 103  CO2 23 24  BUN 19 17  CREATININE 0.84 0.86  CALCIUM 9.6 8.8*  PROT 8.2*  --   BILITOT 0.5  --   ALKPHOS 109  --   ALT 28  --   AST 52*  --   GLUCOSE 92 132*   Lab Results  Component Value Date   TROPONINI 0.06 (HH) 07/25/2016      Radiology: Brain ct showed multiple 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 No obviouis acute changes.  EKG: nsr with no ischemia  ASSESSMENT AND PLAN:  45 yo female with history of Candida endocarditis status post mitral valve replacement in July in [place]. She has a history of polysubstance abuse including IV drug use. She now presents with altered mental status. Brain CT showed multiple infarcts which appeared to be subacute to chronic in nature. There are no acute changes. She is afebrile and white blood cell count is normal. Cultures are pending. She has been noncompliant with her medications including her fluconazole as well as warfarin and more recently subcutaneous Lovenox. She is able to give a history but is somewhat diffuse. She  is now on IV heparin and will need to be placed back on warfarin. She is on IV fluconazole as well as vancomycin and IV Zosyn. Will proceed with a transthoracic echo today with consideration for transesophageal  echo next week to evaluate her prosthetic valve in the setting of subtherapeutic anticoagulation. Appreciate infectious disease input. Signed: Dalia Heading MD, [facility] 07/26/2016, 8:14 AM

## 2016-07-26 NOTE — Progress Notes (Signed)
ANTICOAGULATION CONSULT NOTE - Initial Consult  Pharmacy Consult for heparin drip Indication: mechanical mitral valve  No Known Allergies  Patient Measurements: Height: 5\' 4"  (162.6 cm) Weight: 114 lb 14.4 oz (52.1 kg) IBW/kg (Calculated) : 54.7 Heparin Dosing Weight: 52 kg  Vital Signs: Temp: 98.2 F (36.8 C) (08/12 0526) Temp Source: Oral (08/12 0526) BP: 112/76 (08/12 0526) Pulse Rate: 90 (08/12 0526)  Labs:  Recent Labs  07/25/16 1804 07/25/16 1820 07/26/16 0037 07/26/16 0731  HGB 10.6*  --  10.1*  --   HCT 32.3*  --  29.9*  --   PLT 325  --  300  --   APTT  --   --  36  --   LABPROT 21.3*  --   --   --   INR 1.82  --   --   --   HEPARINUNFRC  --   --   --  0.42  CREATININE  --  0.84 0.86  --   TROPONINI 0.06*  --   --   --     Estimated Creatinine Clearance: 67.2 mL/min (by C-G formula based on SCr of 0.86 mg/dL).   Medical History: Past Medical History:  Diagnosis Date  . Anxiety   . H/O endocarditis   . H/O mitral valve replacement   . HLD (hyperlipidemia)     Medications:  On warfarin as outpatient.   Assessment: INR subtherapeutic on admission.   Goal of Therapy:  Heparin level 0.3-0.7 units/ml Monitor platelets by anticoagulation protocol: Yes   Plan:  Heparin level= 0.42. Will continue heparin infusion at 900 units/hr and recheck a HL in 6 hours.   Luisa Harthristy, Dayshaun Whobrey D 07/26/2016,8:57 AM

## 2016-07-26 NOTE — Progress Notes (Signed)
ANTICOAGULATION CONSULT NOTE - Initial Consult  Pharmacy Consult for heparin drip Indication: mechanical mitral valve  No Known Allergies  Patient Measurements: Height: 5\' 4"  (162.6 cm) Weight: 114 lb 14.4 oz (52.1 kg) IBW/kg (Calculated) : 54.7 Heparin Dosing Weight: 52 kg  Vital Signs: Temp: 98.5 F (36.9 C) (08/12 1316) Temp Source: Oral (08/12 1316) BP: 115/70 (08/12 1316) Pulse Rate: 101 (08/12 1316)  Labs:  Recent Labs  07/25/16 1804 07/25/16 1820 07/26/16 0037 07/26/16 0731 07/26/16 1418  HGB 10.6*  --  10.1*  --   --   HCT 32.3*  --  29.9*  --   --   PLT 325  --  300  --   --   APTT  --   --  36  --   --   LABPROT 21.3*  --   --   --   --   INR 1.82  --   --   --   --   HEPARINUNFRC  --   --   --  0.42 0.17*  CREATININE  --  0.84 0.86  --   --   TROPONINI 0.06*  --   --   --   --     Estimated Creatinine Clearance: 67.2 mL/min (by C-G formula based on SCr of 0.86 mg/dL).   Medical History: Past Medical History:  Diagnosis Date  . Anxiety   . H/O endocarditis   . H/O mitral valve replacement   . HLD (hyperlipidemia)     Medications:  On warfarin as outpatient.   Assessment: INR subtherapeutic on admission.   Goal of Therapy:  Heparin level 0.3-0.7 units/ml Monitor platelets by anticoagulation protocol: Yes   Plan:  Heparin level= 0.17. Drip only paused long enough for venous puncture per RN. Will bolus heparin 1500 units iv and increase infusion to 1100 units/hr. Will recheck a HL in 6 hours.    Luisa HartChristy, Flay Ghosh D 07/26/2016,3:12 PM

## 2016-07-26 NOTE — Progress Notes (Signed)
Pt becoming more agitated, restless, unable to keep in bed; talking to persons who are not there. Confuses. CIWA not availabe yet. Dr. Eliane DecreeS. Patel paged and here. Staffed with MD regarding pt's altered mental status, confusion, increasing agitation/anxiety. New orders received. Haldol IV given. Nicotine patch applied. Diet advanced per pt request. Will continue to assess. Press photographerCharge nurse, Melina SchoolsRobyn advises to Recruitment consultantsafety sitter available. HIGH fall risk meausures  Continued with pt very unsteady when gets OOB.

## 2016-07-26 NOTE — Progress Notes (Signed)
[facility] - [place] at [facility]   PATIENT NAME: Rachel Morrison    MRN#:  [id number]  DATE OF BIRTH:  August 14, 1971  SUBJECTIVE:  Hospital Day: 1 day Rachel Morrison is a 45 y.o. female presenting with Altered Mental Status .    Interval Events: called by nursing staff for continued agitation. Patient unable to provide any information at this point  REVIEW OF SYSTEMS:  Unable to obtain given mental status  DRUG ALLERGIES:  No Known Allergies  VITALS:  Blood pressure 115/70, pulse (!) 101, temperature 98.5 F (36.9 C), temperature source Oral, resp. rate (!) 22, height  (1.626 m), weight 52.1 kg (114 lb 14.4 oz), last menstrual period 07/09/2016, SpO2 100 %.  PHYSICAL EXAMINATION:   VITAL SIGNS: Vitals:   07/26/16 0526 07/26/16 1316  BP: 112/76 115/70  Pulse: 90 (!) 101  Resp: 20 (!) 22  Temp: 98.2 F (36.8 C) 98.5 F (36.9 C)   GENERAL:44 y.o.female moderate distress given mental status.  HEAD: Normocephalic, atraumatic.  EYES: Pupils equal, round, reactive to light. Unable to assess extraocular muscles given mental status/medical condition. No scleral icterus.  MOUTH: Moist mucosal membrane. Dentition intact. No abscess noted.  EAR, NOSE, THROAT: Clear without exudates. No external lesions.  NECK: Supple. No thyromegaly. No nodules. No JVD.  PULMONARY: Clear to ascultation, without wheeze rails or rhonci. No use of accessory muscles, Good respiratory effort. good air entry bilaterally CHEST: Nontender to palpation.  CARDIOVASCULAR: S1 and S2. Regular rate and rhythm. No murmurs, rubs, or gallops. No edema. Pedal pulses 2+ bilaterally.  GASTROINTESTINAL: Soft, nontender, nondistended. No masses. Positive bowel sounds. No hepatosplenomegaly.  MUSCULOSKELETAL: No swelling, clubbing, or edema. Range of motion full in all extremities.  NEUROLOGIC: Unable to assess given mental status/medical condition SKIN: No ulceration, lesions, rashes, or  cyanosis. Skin warm and dry. Turgor intact.  PSYCHIATRIC: agitated, constantly moving, fidgeting, trying to stand up in bed, unable to redirect Unable to assess given mental status/medical condition      LABORATORY PANEL:   CBC  Recent Labs Lab 07/26/16 0037  WBC 6.2  HGB 10.1*  HCT 29.9*  PLT 300   ------------------------------------------------------------------------------------------------------------------  Chemistries   Recent Labs Lab 07/25/16 1820 07/26/16 0037  NA 136 136  K 3.8 3.2*  CL 103 103  CO2 23 24  GLUCOSE 92 132*  BUN 19 17  CREATININE 0.84 0.86  CALCIUM 9.6 8.8*  AST 52*  --   ALT 28  --   ALKPHOS 109  --   BILITOT 0.5  --    ------------------------------------------------------------------------------------------------------------------  Cardiac Enzymes  Recent Labs Lab 07/25/16 1804  TROPONINI 0.06*   ------------------------------------------------------------------------------------------------------------------  RADIOLOGY:  Dg Chest 1 View  Result Date: 07/25/2016 CLINICAL DATA:  Altered mental status. Recent mitral valve replacement. EXAM: CHEST 1 VIEW COMPARISON:  None. FINDINGS: Sternotomy wires appear aligned and intact. Mitral valve prosthesis is in place. Normal heart size. Normal mediastinal contour. No pneumothorax. No pleural effusion. Lungs appear clear, with no acute consolidative airspace disease and no pulmonary edema. IMPRESSION: No active disease. Electronically Signed   By: Delbert Phenix M.D.   On: 07/25/2016 18:40   Ct Head Wo Contrast  Result Date: 07/25/2016 CLINICAL DATA:  Altered mental status. Anticoagulated on Coumadin for recent mitral valve replacement. Confusion and hallucinations. EXAM: CT HEAD WITHOUT CONTRAST TECHNIQUE: Contiguous axial images were obtained from the base of the skull through the vertex without intravenous contrast. COMPARISON:  None. FINDINGS: There is a large  focus of encephalomalacia  in the medial right occipital lobe with associated ex vacuo dilatation of the occipital horn of the right lateral ventricle. There is a small focus of encephalomalacia in the left superior cerebellar hemisphere. There is a hypoechoic focus in the right thalamus. No evidence of parenchymal hemorrhage or extra-axial fluid collection. No mass lesion, mass effect, or midline shift. Otherwise no ventriculomegaly. The visualized paranasal sinuses are essentially clear. The mastoid air cells are unopacified. No evidence of calvarial fracture. IMPRESSION: 1. Large focus of encephalomalacia in the medial right occipital lobe with ex vacuo dilatation of the occipital horn of the right lateral ventricle, most suggestive of a chronic infarct. 2. Small focus of encephalomalacia in the left superior cerebellar hemisphere, most suggestive of a late subacute to chronic infarct. 3. Hypoechoic focus in the right thalamus, suggesting an infarct of uncertain chronicity, probably subacute to chronic. Correlate with brain MRI as clinically warranted. 4. No acute intracranial hemorrhage. No significant mass effect or midline shift. Electronically Signed   By: Delbert PhenixJason A Poff M.D.   On: 07/25/2016 18:50    EKG:   Orders placed or performed during the hospital encounter of 07/25/16  . EKG 12-Lead  . EKG 12-Lead    ASSESSMENT AND PLAN:   Rachel Morrison is a 45 y.o. female presenting with Altered Mental Status . Admitted 07/25/2016 : Day #: 1 day  1. Metabolic encephalopathy: management per neurology, psych, hospitalist. Given acute agitation which seems to be worsening despite haldol/ativan. It seems the best option will be to try precedex with goal RASS 0  - transfer orders/ medication orders in- updated family and nursing   All the records are reviewed and case discussed with Care Management/Social Workerr. Management plans discussed with the patient, family and they are in agreement.  CODE STATUS: full TOTAL TIME TAKING  CARE OF THIS PATIENT: 45 critical minutes.      Hower,  Mardi MainlandDavid K M.D on 07/26/2016 at 8:25 PM  Between 7am to 6pm - Pager - [phone]  After 6pm: House Pager: - [phone]  [facility]  Office  [phone]  CC: Primary care physician; No PCP Per Patient

## 2016-07-26 NOTE — Progress Notes (Signed)
Pt uncontrolled agitation, delirium.  Called Dr Clint GuyHower and discussed.  Unable to keep pt in bed.  Pt with active hallucinations, unable to re-orient.  Pt high risk for falls and traumatic removal of IVs.  Pt has IV heparin and Zosyn running at this time.  Safety sitter and boyfriend have been at both sides of bed continually trying to keep pt from standing up in bed, pulling out IVs, etc.  Pt taken to bathroom and able to void 300 cc clear urine but very aggressive and fighting to leave room.  Able to get pt to bed and other family members came and are supporting sitter to keep pt calm until transfer. Pt was given doses of haldol at 1752 and ativan at 1725  which appeared to make pt worse and have no effect.  Henriette CombsSarah Riordan Walle RN

## 2016-07-26 NOTE — Progress Notes (Addendum)
ANTICOAGULATION CONSULT NOTE - Initial Consult  Pharmacy Consult for heparin drip Indication: mechanical mitral valve  No Known Allergies  Patient Measurements: Height: 5\' 4"  (162.6 cm) Weight: 114 lb 14.4 oz (52.1 kg) IBW/kg (Calculated) : 54.7 Heparin Dosing Weight: 52 kg  Vital Signs: Temp: 98 F (36.7 C) (08/12 2045) Temp Source: Oral (08/12 2045) BP: 136/78 (08/12 2045) Pulse Rate: 93 (08/12 2045)  Labs:  Recent Labs  07/25/16 1804 07/25/16 1820 07/26/16 0037 07/26/16 0731 07/26/16 1418 07/26/16 2133  HGB 10.6*  --  10.1*  --   --   --   HCT 32.3*  --  29.9*  --   --   --   PLT 325  --  300  --   --   --   APTT  --   --  36  --   --   --   LABPROT 21.3*  --   --   --   --   --   INR 1.82  --   --   --   --   --   HEPARINUNFRC  --   --   --  0.42 0.17* 0.53  CREATININE  --  0.84 0.86  --   --   --   TROPONINI 0.06*  --   --   --   --   --     Estimated Creatinine Clearance: 67.2 mL/min (by C-G formula based on SCr of 0.86 mg/dL).   Medical History: Past Medical History:  Diagnosis Date  . Anxiety   . H/O endocarditis   . H/O mitral valve replacement   . HLD (hyperlipidemia)     Medications:  On warfarin as outpatient.   Assessment: INR subtherapeutic on admission.   Goal of Therapy:  Heparin level 0.3-0.7 units/ml Monitor platelets by anticoagulation protocol: Yes   Plan:  Heparin level= 0.17. Drip only paused long enough for venous puncture per RN. Will bolus heparin 1500 units iv and increase infusion to 1100 units/hr. Will recheck a HL in 6 hours.    8/12 21:30 heparin level 0.53. Recheck with AM labs to confirm.  8/13 AM heparin level 1.05. Hold drip x 1 hours and restart at 950 units/hr. Recheck in 6 hours.  Zeshan Sena S 07/26/2016,10:21 PM

## 2016-07-26 NOTE — Consult Note (Signed)
Reason for Consult:Altered mental status Referring Physician: Allena Katz  CC: Hallucinations  HPI: Rachel Morrison is an 45 y.o. female who was brought in by her significant other stating that she tried to jump out of a car today because she saw someone in the back seat that she thought they were telling him to kill her. Patient states that she has had some hallucinations since her surgery earlier this year but they have gotten worse recently.  Significant other denies and reports that they have only been present for the past 24 hours.   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 She's had difficult time maintaining therapeutic INR with several episodes of subtherapeutic INR. She denies any recent infectious symptoms.  Patient is currently quite agitated and at times speaking as if she is in conversation with someone that is not actually talking to her.   Patient is on chronic Xanax that she has been out of for the past few days.    Past Medical History:  Diagnosis Date  . Anxiety   . H/O endocarditis   . H/O mitral valve replacement   . HLD (hyperlipidemia)     Past Surgical History:  Procedure Laterality Date  . CARDIAC VALVE SURGERY      Family History  Problem Relation Age of Onset  . Diabetes Mother   . Sleep apnea Mother   . Heart attack Father   . Hypertension Father     Social History:  reports that she has been smoking Cigarettes.  She has a 45.00 pack-year smoking history. She does not have any smokeless tobacco history on file. Her alcohol and drug histories are not on file.  No Known Allergies  Medications:  I have reviewed the patient's current medications. Prior to Admission:  Prescriptions Prior to Admission  Medication Sig Dispense Refill Last Dose  . ALPRAZolam (XANAX) 1 MG tablet Take 1 tablet by mouth at bedtime as needed.     . enoxaparin (LOVENOX) 60 MG/0.6ML injection Inject into the skin.     . DULoxetine (CYMBALTA) 30  MG capsule Take 1 capsule by mouth daily.     Marland Kitchen warfarin (COUMADIN) 2.5 MG tablet Take 1 tablet by mouth daily.      Scheduled: . DULoxetine  30 mg Oral Daily  . [START ON 07/27/2016] fluconazole (DIFLUCAN) IV  400 mg Intravenous Q24H  . folic acid  1 mg Oral Daily  . LORazepam  0-4 mg Intravenous Q6H   Followed by  . [START ON 07/27/2016] LORazepam  0-4 mg Intravenous Q12H  . multivitamin with minerals  1 tablet Oral Daily  . nicotine  21 mg Transdermal Daily  . piperacillin-tazobactam (ZOSYN)  IV  4.5 g Intravenous Q8H  . sodium chloride flush  3 mL Intravenous Q12H  . sodium chloride flush  3 mL Intravenous Q12H  . thiamine  100 mg Oral Daily   Or  . thiamine  100 mg Intravenous Daily  . vancomycin  750 mg Intravenous Q12H    ROS: History obtained from the patient  General ROS: negative for - chills, fatigue, fever, night sweats, weight gain or weight loss Psychological ROS: as noted in HPI Ophthalmic ROS: negative for - blurry vision, double vision, eye pain or loss of vision ENT ROS: negative for - epistaxis, nasal discharge, oral lesions, sore throat, tinnitus or vertigo Allergy and Immunology ROS: negative for - hives or itchy/watery eyes Hematological and Lymphatic ROS: negative for - bleeding problems, bruising or swollen  lymph nodes Endocrine ROS: negative for - galactorrhea, hair pattern changes, polydipsia/polyuria or temperature intolerance Respiratory ROS: negative for - cough, hemoptysis, shortness of breath or wheezing Cardiovascular ROS: negative for - chest pain, dyspnea on exertion, edema or irregular heartbeat Gastrointestinal ROS: negative for - abdominal pain, diarrhea, hematemesis, nausea/vomiting or stool incontinence Genito-Urinary ROS: negative for - dysuria, hematuria, incontinence or urinary frequency/urgency Musculoskeletal ROS: negative for - joint swelling or muscular weakness Neurological ROS: as noted in HPI Dermatological ROS: negative for rash and  skin lesion changes  Physical Examination: Blood pressure 115/70, pulse (!) 101, temperature 98.5 F (36.9 C), temperature source Oral, resp. rate (!) 22, height 5\' 4"  (1.626 m), weight 52.1 kg (114 lb 14.4 oz), last menstrual period 07/09/2016, SpO2 100 %.  HEENT-  Normocephalic, no lesions, without obvious abnormality.  Normal external eye and conjunctiva.  Normal TM's bilaterally.  Normal auditory canals and external ears. Normal external nose, mucus membranes and septum.  Normal pharynx. Cardiovascular- S1, S2 normal, pulses palpable throughout   Lungs- chest clear, no wheezing, rales, normal symmetric air entry Abdomen- soft, non-tender; bowel sounds normal; no masses,  no organomegaly Extremities- no edema Lymph-no adenopathy palpable Musculoskeletal-no joint tenderness, deformity or swelling Skin-warm and dry, no hyperpigmentation, vitiligo, or suspicious lesions  Neurological Examination Mental Status: Alert. Will not answer questions concerning orientation.  Speech fluent without evidence of aphasia.  Able to follow simple commands but unable to follow 3 step commands.  Uncooperative. Cranial Nerves: II: Discs flat bilaterally; Blinks to bilateral confrontation, pupils round, reactive to light and accommodation III,IV, VI: ptosis not present, extra-ocular motions intact bilaterally V,VII: smile symmetric, facial light touch sensation normal bilaterally VIII: hearing normal bilaterally IX,X: gag reflex present XI: bilateral shoulder shrug XII: midline tongue extension Motor: Moves all extremities strongly against gravity.  Agitated and moving continuously. Sensory: Responds to tactile stimuli in all extremities.   Deep Tendon Reflexes: 2+ and symmetric throughout Plantars: Right: downgoing   Left: downgoing Cerebellar: Unable to perform secondary to cooperation Gait: not tested due to safety concerns   Laboratory Studies:   Basic Metabolic Panel:  Recent Labs Lab  07/25/16 1820 07/26/16 0037  NA 136 136  K 3.8 3.2*  CL 103 103  CO2 23 24  GLUCOSE 92 132*  BUN 19 17  CREATININE 0.84 0.86  CALCIUM 9.6 8.8*    Liver Function Tests:  Recent Labs Lab 07/25/16 1820  AST 52*  ALT 28  ALKPHOS 109  BILITOT 0.5  PROT 8.2*  ALBUMIN 4.2    Recent Labs Lab 07/25/16 1820  LIPASE 65*    Recent Labs Lab 07/25/16 1820  AMMONIA 13    CBC:  Recent Labs Lab 07/25/16 1804 07/26/16 0037  WBC 7.5 6.2  NEUTROABS 3.9  --   HGB 10.6* 10.1*  HCT 32.3* 29.9*  MCV 78.9* 77.8*  PLT 325 300    Cardiac Enzymes:  Recent Labs Lab 07/25/16 1804  TROPONINI 0.06*    BNP: Invalid input(s): POCBNP  CBG: No results for input(s): GLUCAP in the last 168 hours.  Microbiology: Results for orders placed or performed during the hospital encounter of 07/25/16  Blood culture (routine x 2)     Status: None (Preliminary result)   Collection Time: 07/25/16  7:01 PM  Result Value Ref Range Status   Specimen Description BLOOD LEFT ARM  Final   Special Requests BOTTLES DRAWN AEROBIC AND ANAEROBIC 7CCAERO,7CCANA  Final   Culture NO GROWTH < 12 HOURS  Final  Report Status PENDING  Incomplete  Blood culture (routine x 2)     Status: None (Preliminary result)   Collection Time: 07/25/16  7:01 PM  Result Value Ref Range Status   Specimen Description BLOOD LEFT ASSIST CONTROL  Final   Special Requests BOTTLES DRAWN AEROBIC AND ANAEROBIC [id number],[id number]  Final   Culture NO GROWTH < 12 HOURS  Final   Report Status PENDING  Incomplete  Blood culture (single)     Status: None (Preliminary result)   Collection Time: 07/25/16  8:34 PM  Result Value Ref Range Status   Specimen Description BLOOD LEFT FOREARM  Final   Special Requests BOTTLES DRAWN AEROBIC AND ANAEROBIC [id number],8CCANA  Final   Culture NO GROWTH < 12 HOURS  Final   Report Status PENDING  Incomplete    Coagulation Studies:  Recent Labs  07/25/16 1804  LABPROT 21.3*  INR 1.82     Urinalysis:  Recent Labs Lab 07/25/16 1910  COLORURINE STRAW*  LABSPEC 1.006  PHURINE 8.0  GLUCOSEU NEGATIVE  HGBUR NEGATIVE  BILIRUBINUR NEGATIVE  KETONESUR NEGATIVE  PROTEINUR NEGATIVE  NITRITE NEGATIVE  LEUKOCYTESUR NEGATIVE    Lipid Panel:  No results found for: CHOL, TRIG, HDL, CHOLHDL, VLDL, LDLCALC  HgbA1C: No results found for: HGBA1C  Urine Drug Screen:     Component Value Date/Time   LABOPIA NONE DETECTED 07/25/2016 1910   COCAINSCRNUR NONE DETECTED 07/25/2016 1910   LABBENZ POSITIVE (A) 07/25/2016 1910   AMPHETMU NONE DETECTED 07/25/2016 1910   THCU NONE DETECTED 07/25/2016 1910   LABBARB NONE DETECTED 07/25/2016 1910    Alcohol Level:  Recent Labs Lab 07/25/16 1804  ETH <5    Other results: EKG: sinus rhythm at 91 bpm.  Imaging: Dg Chest 1 View  Result Date: 07/25/2016 CLINICAL DATA:  Altered mental status. Recent mitral valve replacement. EXAM: CHEST 1 VIEW COMPARISON:  None. FINDINGS: Sternotomy wires appear aligned and intact. Mitral valve prosthesis is in place. Normal heart size. Normal mediastinal contour. No pneumothorax. No pleural effusion. Lungs appear clear, with no acute consolidative airspace disease and no pulmonary edema. IMPRESSION: No active disease. Electronically Signed   By: Delbert PhenixJason A Poff M.D.   On: 07/25/2016 18:40   Ct Head Wo Contrast  Result Date: 07/25/2016 CLINICAL DATA:  Altered mental status. Anticoagulated on Coumadin for recent mitral valve replacement. Confusion and hallucinations. EXAM: CT HEAD WITHOUT CONTRAST TECHNIQUE: Contiguous axial images were obtained from the base of the skull through the vertex without intravenous contrast. COMPARISON:  None. FINDINGS: There is a large focus of encephalomalacia in the medial right occipital lobe with associated ex vacuo dilatation of the occipital horn of the right lateral ventricle. There is a small focus of encephalomalacia in the left superior cerebellar hemisphere. There is  a hypoechoic focus in the right thalamus. No evidence of parenchymal hemorrhage or extra-axial fluid collection. No mass lesion, mass effect, or midline shift. Otherwise no ventriculomegaly. The visualized paranasal sinuses are essentially clear. The mastoid air cells are unopacified. No evidence of calvarial fracture. IMPRESSION: 1. Large focus of encephalomalacia in the medial right occipital lobe with ex vacuo dilatation of the occipital horn of the right lateral ventricle, most suggestive of a chronic infarct. 2. Small focus of encephalomalacia in the left superior cerebellar hemisphere, most suggestive of a late subacute to chronic infarct. 3. Hypoechoic focus in the right thalamus, suggesting an infarct of uncertain chronicity, probably subacute to chronic. Correlate with brain MRI as clinically warranted. 4. No acute intracranial hemorrhage. No  significant mass effect or midline shift. Electronically Signed   By: Delbert Phenix M.D.   On: 07/25/2016 18:50     Assessment/Plan: 45 year old female presenting with hallucination and mental status changes.  No focal findings on neurological examination and history obtained changes often.  Unclear contribution of substance abuse.  Drug screen positive or benzodiazepines.  Head CT personally reviewed and shows possible new lesions.  With history of endocarditis and low INR unclear if these are ischemic or infectious.  Currently patient in no state to be able to tolerate further imaging.    Recommendations: 1.  Agree with further cardiac evaluation 2.  Agree with addressing INR 3.  Concern that patient may be having be becoming more agitated with the Ativan.  Patient has not slept in many days as well which may not be helping the situation.  Would consider weaning off Ativan.  May restart Xanax and consider an antipsychotic with sedative effects such as Geodon or Seroquel.   4.  MRI of the brain with and without contrast  Thana Farr,  MD Neurology [phone] 07/26/2016, 1:21 PM

## 2016-07-26 NOTE — BH Assessment (Signed)
Writer informed psychiatrist (Dr. Clapacs) of consult. 

## 2016-07-26 NOTE — Evaluation (Signed)
Physical Therapy Evaluation Patient Details Name: Rachel GheeJanice Morrison MRN: [id number] DOB: 10-Jul-1971 Today's Date: 07/26/2016   History of Present Illness  45 y.o. female who presents with Altered mental status and hallucinations: people, formations, etc. Patient states that she has had some other hallucinations recently. Sometimes she can tolerate hallucinations and other times not. 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 She's had difficult time maintaining therapeutic INR with several episodes of subtherapeutic INR. She denies any recent infectious symptoms. 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  Clinical Impression  Pt with inconsistent ambulation with much of the time having unsteady, inconsistent and poor awareness with ambulation.  She is able to negotiation up/down steps with rail and walking ~200 ft w/o AD, but ultimately she is distracted, shows decreased safety awareness and has multiple bouts of hallucinations that decreased her performance and awareness.  Pt is able to show "normal" gait pattern and speed when she is able to stay on point.      Follow Up Recommendations  (per progress, per psych situation)    Equipment Recommendations       Recommendations for Other Services       Precautions / Restrictions Precautions Precautions: Fall Restrictions Weight Bearing Restrictions: No      Mobility  Bed Mobility Overal bed mobility: Modified Independent             General bed mobility comments: supervision secondary to impulsive nature  Transfers Overall transfer level: Modified independent Equipment used: None             General transfer comment: Pt is able to get to standing and  though she has some unsteadiness she appears confident  Ambulation/Gait Ambulation/Gait assistance: Min assist Ambulation Distance (Feet): 200 Feet Assistive device: None       General Gait Details: Pt with very inconsistent ambulation.  Much of the time she would take slow, guarded steps with almost constant staggering, occasional very brief buckling of knees and overall being quite unsteady.  She does, however, at times show good consistent cadence and speed when she was not hallucinating and keeping her eyes ahead to where she was walking.   Stairs Stairs: Yes Stairs assistance: Min guard Stair Management: One rail Right Number of Stairs: 3 General stair comments: Pt uses hesitant step-to strategy and was having some internal struggles with steps but was able to safely negotiate up/down w/o direct physical assist.   Wheelchair Mobility    Modified Rankin (Stroke Patients Only)       Balance Overall balance assessment: Needs assistance   Sitting balance-Leahy Scale: Normal       Standing balance-Leahy Scale: Fair Standing balance comment: Pt is inconsistent and has staggering with static standing and during ambulation.  Pt impulsive with decreased awareness                             Pertinent Vitals/Pain      Home Living Family/patient expects to be discharged to:: Private residence Living Arrangements: Spouse/significant other;Other (Comment);Parent Available Help at Discharge: Family   Home Access: Stairs to enter Entrance Stairs-Rails: Right Entrance Stairs-Number of Steps: 3          Prior  Function Level of Independence: Independent         Comments: Apparently she was driving, running errands, but would not allow herself to drive, etc if she was having a bad day.     Hand Dominance        Extremity/Trunk Assessment   Upper Extremity Assessment: Overall WFL for tasks assessed;Generalized weakness           Lower Extremity  Assessment: Overall WFL for tasks assessed;Generalized weakness         Communication   Communication: No difficulties  Cognition Arousal/Alertness: Awake/alert Behavior During Therapy: Anxious;Impulsive;Restless Overall Cognitive Status: Difficult to assess (Pt sees multiple hallucinations (people, floor opening, ice))                      General Comments      Exercises        Assessment/Plan    PT Assessment Patient needs continued PT services  PT Diagnosis Difficulty walking;Generalized weakness   PT Problem List Decreased balance;Decreased coordination;Decreased cognition;Decreased safety awareness  PT Treatment Interventions Gait training;Stair training;Functional mobility training;Therapeutic activities;Therapeutic exercise;Balance training;Neuromuscular re-education;Cognitive remediation   PT Goals (Current goals can be found in the Care Plan section) Acute Rehab PT Goals Patient Stated Goal: figure out why she is hallucinating PT Goal Formulation: With patient Time For Goal Achievement: 08/09/16 Potential to Achieve Goals: Fair    Frequency Min 2X/week   Barriers to discharge        Co-evaluation               End of Session Equipment Utilized During Treatment: Gait belt Activity Tolerance: Patient tolerated treatment well Patient left: with bed alarm set;with call bell/phone within reach;with family/visitor present Nurse Communication: Mobility status         Time: 4098-1191 PT Time Calculation (min) (ACUTE ONLY): 22 min   Charges:   PT Evaluation $PT Eval Low Complexity: 1 Procedure     PT G CodesMalachi Pro, DPT 07/26/2016, 11:33 AM

## 2016-07-26 NOTE — Progress Notes (Signed)
ANTICOAGULATION CONSULT NOTE - Initial Consult  Pharmacy Consult for heparin drip Indication: mechanical mitral valve  No Known Allergies  Patient Measurements: Height: 5\' 4"  (162.6 cm) Weight: 114 lb 14.4 oz (52.1 kg) IBW/kg (Calculated) : 54.7 Heparin Dosing Weight: 52 kg  Vital Signs: Temp: 98.6 F (37 C) (08/11 2310) Temp Source: Oral (08/11 2310) BP: 139/79 (08/11 2310) Pulse Rate: 98 (08/11 2310)  Labs:  Recent Labs  07/25/16 1804 07/25/16 1820 07/26/16 0037  HGB 10.6*  --  10.1*  HCT 32.3*  --  29.9*  PLT 325  --  300  APTT  --   --  36  LABPROT 21.3*  --   --   INR 1.82  --   --   CREATININE  --  0.84 0.86  TROPONINI 0.06*  --   --     Estimated Creatinine Clearance: 67.2 mL/min (by C-G formula based on SCr of 0.86 mg/dL).   Medical History: Past Medical History:  Diagnosis Date  . Anxiety   . H/O endocarditis   . H/O mitral valve replacement   . HLD (hyperlipidemia)     Medications:  On warfarin as outpatient.   Assessment: INR subtherapeutic on admission.   Goal of Therapy:  Heparin level 0.3-0.7 units/ml Monitor platelets by anticoagulation protocol: Yes   Plan:  Heparin 3000 unit bolus and initial rate of 900 units/hr ordered. First heparin level 6 hours after start of infusion.  Aison Malveaux S 07/26/2016,1:51 AM

## 2016-07-26 NOTE — Consult Note (Signed)
[facility] Consult   Reason for Consult:  Consult for this 45 year old woman who presented to the emergency room with confusion and agitation Referring Physician:  Posey Pronto Patient Identification: Rachel Morrison MRN:  [id number] Principal Diagnosis: Acute delirium Diagnosis:   Patient Active Problem List   Diagnosis Date Noted  . Acute delirium [R41.0] 07/26/2016  . 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 Altered mental status [R41.82] 07/25/2016  . Brain lesion [G93.9] 07/25/2016  . Anxiety [F41.9] 07/25/2016  . H/O endocarditis [Z86.79] 07/25/2016  . Altered mental state [R41.82] 07/25/2016    Total Time spent with patient: 1 hour  Subjective:   Rachel Morrison is a 45 y.o. female patient admitted with "it's [place]!".  HPI:  Patient interviewed. Also spoke with her significant other who is present in the room. Chart reviewed. I reviewed notes through care everywhere as well and also reviewed her data from the controlled substance database. This 45 year old woman presented to the emergency room yesterday with confusion and agitation. According to her friend she became acutely agitated on Wednesday at which time she started to throw some confusion. This was more confused than normally but he settled her down and she got some sleep. All day Thursday and Friday however she has been much more confused and agitated. Yesterday apparently she tried to jump out of a moving car. Through a lot of belongings out of a car, seem to be frequently confused. He says that she has talked about going places and visiting people that do not make sense. This has been pretty consistent for the last couple days. Patient's friend denies that she had been abusing any drugs in the time since her heart surgery. He denies that she has been on any a new medicine recently and says he doesn't think there is any way she could've been taking any pain medicine. He says that she was given a prescription  for a modest dose of Xanax recently. That bottle is not present among her belongings and he says that she threw it out the window. He does not think that she had overdosed on it. He does not think that she could've been using any alcohol. He says that as far as he know she had been otherwise cooperative with medications. 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 She has been on anticoagulation since then. It is a little unclear to me from the history how consistently she has been able to keep up with all of her appointments. The friend reports that after the heart surgery she would occasionally have spells of acute confusion but that these would never last more than a few seconds at a time. Nothing lasted nearly as long as what she has right now. There is no indication that she had any actual suicidality or wish to die. She had not been having any other known psychiatric syndrome prior to this acute confusion. On evaluation today the patient was unable to give any history herself. She was extremely confused and labile. She required constant intervention from nursing or from her friend to keep her from climbing out of bed. Her speech was nonsensical and her mental status waxed and waned as well as did her affect. Clearly disoriented.  Social history: Because of her acute agitation I was able to give only limited information. Apparently the patient and her friend had been living in the [place] area until fairly recently. I'm not sure if the reason  they relocated up to this area was because of the heart surgery. In any case since being out of the hospital it sounds like they're living in the [place] area probably with some family members.  Medical history: History of endocarditis. 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 Outside of that however no other known chronic mental  health problems. She had had brain scans at [facility] around the time that she was having the heart valve replaced. I'm not sure that I have the expertise to make a comparison between those and her most recent CT just based on the verbal descriptions of them. Nevertheless it's clear that she's had strokes over time.  Substance abuse history: Patient was not able to give any information her and her friend was not particularly forthcoming. Information I got from the old chart however suggests that it was known that she had problems with intravenous opiate abuse in the past. How recently this had been going on is unclear. Boyfriend reports that he doesn't think she's had any drug or alcohol problems any time since her surgery. It looks like she was being prescribed Xanax up to 3 times a day in the past. He says she was on that for years from her primary care doctor in [place]. He says that she never abused it and usually took less than the 3 mg a day. She did get a prescription just a couple weeks ago at [facility] which is not currently present. Boyfriend denies ever having seen her delirious in a manner like this before in any context related to substance abuse.    Past Psychiatric History: She had apparently been prescribed 30 mg of Cymbalta at some point but according to him this was as part of a pain regimen. As far as he know she has not had any other psychiatric treatment. He is not aware of any history of suicidality or violence or psychotic disorder. Not aware of any psychiatric hospitalization. No evidence of any other psychoactive medicines except for the Cymbalta which looks like it has been empty for quite a while, Xanax which is not present, and a brief prednisone taper which should've run out several weeks ago.  Risk to Self: Is patient at risk for suicide?: No Risk to Others:   Prior Inpatient Therapy:   Prior Outpatient Therapy:    Past Medical History:  Past Medical History:  Diagnosis Date  .  Anxiety   . H/O endocarditis   . H/O mitral valve replacement   . HLD (hyperlipidemia)     Past Surgical History:  Procedure Laterality Date  . CARDIAC VALVE SURGERY     Family History:  Family History  Problem Relation Age of Onset  . Diabetes Mother   . Sleep apnea Mother   . Heart attack Father   . Hypertension Father    Family Psychiatric  History: Patient couldn't give any history. Boyfriend denies any knowledge of family history. Social History:  History  Alcohol use Not on file     History  Drug use: Unknown    Social History   Social History  . Marital status: Married    Spouse name: N/A  . Number of children: N/A  . Years of education: N/A   Social History Main Topics  . Smoking status: Current Every Day Smoker    Packs/day: 1.50    Years: 30.00    Types: Cigarettes  .  Smokeless tobacco: None  . Alcohol use None  . Drug use: Unknown  . Sexual activity: Not Asked   Other Topics Concern  . None   Social History Narrative  . None   Additional Social History:    Allergies:  No Known Allergies  Labs:  Results for orders placed or performed during the hospital encounter of 07/25/16 (from the past 48 hour(s))  CBC with Differential     Status: Abnormal   Collection Time: 07/25/16  6:04 PM  Result Value Ref Range   WBC 7.5 3.6 - 11.0 K/uL   RBC 4.10 3.80 - 5.20 MIL/uL   Hemoglobin 10.6 (L) 12.0 - 16.0 g/dL   HCT 32.3 (L) 35.0 - 47.0 %   MCV 78.9 (L) 80.0 - 100.0 fL   MCH 25.8 (L) 26.0 - 34.0 pg   MCHC 32.7 32.0 - 36.0 g/dL   RDW 18.8 (H) 11.5 - 14.5 %   Platelets 325 150 - 440 K/uL   Neutrophils Relative % 53 %   Neutro Abs 3.9 1.4 - 6.5 K/uL   Lymphocytes Relative 37 %   Lymphs Abs 2.7 1.0 - 3.6 K/uL   Monocytes Relative 10 %   Monocytes Absolute 0.7 0.2 - 0.9 K/uL   Eosinophils Relative 1 %   Eosinophils Absolute 0.1 0 - 0.7 K/uL   Basophils Relative 1 %   Basophils Absolute 0.0 0 - 0.1 K/uL  Ethanol     Status: None   Collection Time:  07/25/16  6:04 PM  Result Value Ref Range   Alcohol, Ethyl (B) <5 <5 mg/dL    Comment:        LOWEST DETECTABLE LIMIT FOR SERUM ALCOHOL IS 5 mg/dL FOR MEDICAL PURPOSES ONLY   Troponin I     Status: Abnormal   Collection Time: 07/25/16  6:04 PM  Result Value Ref Range   Troponin I 0.06 (HH) <0.03 ng/mL    Comment: CRITICAL RESULT CALLED TO, READ BACK BY AND VERIFIED WITH: ANGELA ROBBINS AT [facility] ON 07/25/16 RWW   Protime-INR     Status: Abnormal   Collection Time: 07/25/16  6:04 PM  Result Value Ref Range   Prothrombin Time 21.3 (H) 11.4 - 15.2 seconds   INR 1.82   Acetaminophen level     Status: Abnormal   Collection Time: 07/25/16  6:04 PM  Result Value Ref Range   Acetaminophen (Tylenol), Serum <10 (L) 10 - 30 ug/mL    Comment:        THERAPEUTIC CONCENTRATIONS VARY SIGNIFICANTLY. A RANGE OF 10-30 ug/mL MAY BE AN EFFECTIVE CONCENTRATION FOR MANY PATIENTS. HOWEVER, SOME ARE BEST TREATED AT CONCENTRATIONS OUTSIDE THIS RANGE. ACETAMINOPHEN CONCENTRATIONS >150 ug/mL AT 4 HOURS AFTER INGESTION AND >50 ug/mL AT 12 HOURS AFTER INGESTION ARE OFTEN ASSOCIATED WITH TOXIC REACTIONS.   Salicylate level     Status: None   Collection Time: 07/25/16  6:04 PM  Result Value Ref Range   Salicylate Lvl <9.2 2.8 - 30.0 mg/dL  Ammonia     Status: None   Collection Time: 07/25/16  6:20 PM  Result Value Ref Range   Ammonia 13 9 - 35 umol/L  Lactic acid, plasma     Status: Abnormal   Collection Time: 07/25/16  6:20 PM  Result Value Ref Range   Lactic Acid, Venous 2.0 (HH) 0.5 - 1.9 mmol/L    Comment: CRITICAL RESULT CALLED TO, READ BACK BY AND VERIFIED WITH ANGELA ROBBINS AT 1857 ON 07/25/16 RWW   Comprehensive  metabolic panel     Status: Abnormal   Collection Time: 07/25/16  6:20 PM  Result Value Ref Range   Sodium 136 135 - 145 mmol/L   Potassium 3.8 3.5 - 5.1 mmol/L   Chloride 103 101 - 111 mmol/L   CO2 23 22 - 32 mmol/L   Glucose, Bld 92 65 - 99 mg/dL   BUN 19 6 - 20 mg/dL    Creatinine, Ser 0.84 0.44 - 1.00 mg/dL   Calcium 9.6 8.9 - 10.3 mg/dL   Total Protein 8.2 (H) 6.5 - 8.1 g/dL   Albumin 4.2 3.5 - 5.0 g/dL   AST 52 (H) 15 - 41 U/L   ALT 28 14 - 54 U/L   Alkaline Phosphatase 109 38 - 126 U/L   Total Bilirubin 0.5 0.3 - 1.2 mg/dL   GFR calc non Af Amer >60 >60 mL/min   GFR calc Af Amer >60 >60 mL/min    Comment: (NOTE) The eGFR has been calculated using the CKD EPI equation. This calculation has not been validated in all clinical situations. eGFR's persistently <60 mL/min signify possible Chronic Kidney Disease.    Anion gap 10 5 - 15  Lipase, blood     Status: Abnormal   Collection Time: 07/25/16  6:20 PM  Result Value Ref Range   Lipase 65 (H) 11 - 51 U/L  Blood culture (routine x 2)     Status: None (Preliminary result)   Collection Time: 07/25/16  7:01 PM  Result Value Ref Range   Specimen Description BLOOD LEFT ARM    Special Requests BOTTLES DRAWN AEROBIC AND ANAEROBIC [id number]    Culture NO GROWTH < 12 HOURS    Report Status PENDING   Blood culture (routine x 2)     Status: None (Preliminary result)   Collection Time: 07/25/16  7:01 PM  Result Value Ref Range   Specimen Description BLOOD LEFT ASSIST CONTROL    Special Requests BOTTLES DRAWN AEROBIC AND ANAEROBIC [id number]    Culture NO GROWTH < 12 HOURS    Report Status PENDING   Urinalysis complete, with microscopic ([facility] only)     Status: Abnormal   Collection Time: 07/25/16  7:10 PM  Result Value Ref Range   Color, Urine STRAW (A) YELLOW   APPearance CLEAR (A) CLEAR   Glucose, UA NEGATIVE NEGATIVE mg/dL   Bilirubin Urine NEGATIVE NEGATIVE   Ketones, ur NEGATIVE NEGATIVE mg/dL   Specific Gravity, Urine 1.006 1.005 - 1.030   Hgb urine dipstick NEGATIVE NEGATIVE   pH 8.0 5.0 - 8.0   Protein, ur NEGATIVE NEGATIVE mg/dL   Nitrite NEGATIVE NEGATIVE   Leukocytes, UA NEGATIVE NEGATIVE   RBC / HPF NONE SEEN 0 - 5 RBC/hpf   WBC, UA 0-5 0 - 5 WBC/hpf   Bacteria, UA RARE (A)  NONE SEEN   Squamous Epithelial / LPF 0-5 (A) NONE SEEN  Urine Drug Screen, Qualitative ([facility] only)     Status: Abnormal   Collection Time: 07/25/16  7:10 PM  Result Value Ref Range   Tricyclic, Ur Screen NONE DETECTED NONE DETECTED   Amphetamines, Ur Screen NONE DETECTED NONE DETECTED   MDMA (Ecstasy)Ur Screen NONE DETECTED NONE DETECTED   Cocaine Metabolite,Ur [place] NONE DETECTED NONE DETECTED   Opiate, Ur Screen NONE DETECTED NONE DETECTED   Phencyclidine (PCP) Ur S NONE DETECTED NONE DETECTED   Cannabinoid 50 Ng, Ur  NONE DETECTED NONE DETECTED   Barbiturates, Ur Screen NONE DETECTED NONE DETECTED   Benzodiazepine,  Ur Scrn POSITIVE (A) NONE DETECTED   Methadone Scn, Ur NONE DETECTED NONE DETECTED    Comment: (NOTE) 732  Tricyclics, urine               Cutoff 1000 ng/mL 200  Amphetamines, urine             Cutoff 1000 ng/mL 300  MDMA (Ecstasy), urine           Cutoff 500 ng/mL 400  Cocaine Metabolite, urine       Cutoff 300 ng/mL 500  Opiate, urine                   Cutoff 300 ng/mL 600  Phencyclidine (PCP), urine      Cutoff 25 ng/mL 700  Cannabinoid, urine              Cutoff 50 ng/mL 800  Barbiturates, urine             Cutoff 200 ng/mL 900  Benzodiazepine, urine           Cutoff 200 ng/mL 1000 Methadone, urine                Cutoff 300 ng/mL 1100 1200 The urine drug screen provides only a preliminary, unconfirmed 1300 analytical test result and should not be used for non-medical 1400 purposes. Clinical consideration and professional judgment should 1500 be applied to any positive drug screen result due to possible 1600 interfering substances. A more specific alternate chemical method 1700 must be used in order to obtain a confirmed analytical result.  1800 Gas chromato graphy / mass spectrometry (GC/MS) is the preferred 1900 confirmatory method.   Pregnancy, urine POC     Status: None   Collection Time: 07/25/16  7:10 PM  Result Value Ref Range   Preg Test, Ur NEGATIVE  NEGATIVE    Comment:        THE SENSITIVITY OF THIS METHODOLOGY IS >24 mIU/mL   Blood culture (single)     Status: None (Preliminary result)   Collection Time: 07/25/16  8:34 PM  Result Value Ref Range   Specimen Description BLOOD LEFT FOREARM    Special Requests BOTTLES DRAWN AEROBIC AND ANAEROBIC [id number]    Culture NO GROWTH < 12 HOURS    Report Status PENDING   Lactic acid, plasma     Status: None   Collection Time: 07/25/16 10:00 PM  Result Value Ref Range   Lactic Acid, Venous 1.9 0.5 - 1.9 mmol/L  Basic metabolic panel     Status: Abnormal   Collection Time: 07/26/16 12:37 AM  Result Value Ref Range   Sodium 136 135 - 145 mmol/L   Potassium 3.2 (L) 3.5 - 5.1 mmol/L   Chloride 103 101 - 111 mmol/L   CO2 24 22 - 32 mmol/L   Glucose, Bld 132 (H) 65 - 99 mg/dL   BUN 17 6 - 20 mg/dL   Creatinine, Ser 0.86 0.44 - 1.00 mg/dL   Calcium 8.8 (L) 8.9 - 10.3 mg/dL   GFR calc non Af Amer >60 >60 mL/min   GFR calc Af Amer >60 >60 mL/min    Comment: (NOTE) The eGFR has been calculated using the CKD EPI equation. This calculation has not been validated in all clinical situations. eGFR's persistently <60 mL/min signify possible Chronic Kidney Disease.    Anion gap 9 5 - 15  CBC     Status: Abnormal   Collection Time: 07/26/16 12:37 AM  Result Value Ref  Range   WBC 6.2 3.6 - 11.0 K/uL   RBC 3.85 3.80 - 5.20 MIL/uL   Hemoglobin 10.1 (L) 12.0 - 16.0 g/dL   HCT 29.9 (L) 35.0 - 47.0 %   MCV 77.8 (L) 80.0 - 100.0 fL   MCH 26.2 26.0 - 34.0 pg   MCHC 33.6 32.0 - 36.0 g/dL   RDW 18.3 (H) 11.5 - 14.5 %   Platelets 300 150 - 440 K/uL  APTT     Status: None   Collection Time: 07/26/16 12:37 AM  Result Value Ref Range   aPTT 36 24 - 36 seconds  Heparin level (unfractionated)     Status: None   Collection Time: 07/26/16  7:31 AM  Result Value Ref Range   Heparin Unfractionated 0.42 0.30 - 0.70 IU/mL    Comment:        IF HEPARIN RESULTS ARE BELOW EXPECTED VALUES, AND  PATIENT DOSAGE HAS BEEN CONFIRMED, SUGGEST FOLLOW UP TESTING OF ANTITHROMBIN III LEVELS.     Current Facility-Administered Medications  Medication Dose Route Frequency Provider Last Rate Last Dose  . acetaminophen (TYLENOL) tablet 650 mg  650 mg Oral Q6H PRN Lance Coon, MD       Or  . acetaminophen (TYLENOL) suppository 650 mg  650 mg Rectal Q6H PRN Lance Coon, MD      . DULoxetine (CYMBALTA) DR capsule 30 mg  30 mg Oral Daily Lance Coon, MD   30 mg at 07/26/16 0847  . [START ON 07/27/2016] fluconazole (DIFLUCAN) IVPB 400 mg  400 mg Intravenous Q24H Orbie Pyo, MD      . folic acid (FOLVITE) tablet 1 mg  1 mg Oral Daily Lance Coon, MD   1 mg at 07/26/16 0846  . haloperidol lactate (HALDOL) injection 2 mg  2 mg Intravenous Q6H PRN Dustin Flock, MD   2 mg at 07/26/16 1125  . haloperidol lactate (HALDOL) injection 5 mg  5 mg Intravenous STAT Gonzella Lex, MD      . haloperidol lactate (HALDOL) injection 5 mg  5 mg Intravenous Q2H PRN Gonzella Lex, MD      . heparin ADULT infusion 100 units/mL (25000 units/234m sodium chloride 0.45%)  900 Units/hr Intravenous Continuous DOrbie Pyo MD 9 mL/hr at 07/26/16 0219 900 Units/hr at 07/26/16 0219  . LORazepam (ATIVAN) injection 0-4 mg  0-4 mg Intravenous Q6H DLance Coon MD   2 mg at 07/26/16 1200   Followed by  . [START ON 07/27/2016] LORazepam (ATIVAN) injection 0-4 mg  0-4 mg Intravenous Q12H DLance Coon MD      . LORazepam (ATIVAN) tablet 1 mg  1 mg Oral Q6H PRN DLance Coon MD       Or  . LORazepam (ATIVAN) injection 1 mg  1 mg Intravenous Q6H PRN DLance Coon MD      . morphine 2 MG/ML injection 2 mg  2 mg Intravenous Q4H PRN DLance Coon MD      . multivitamin with minerals tablet 1 tablet  1 tablet Oral Daily DLance Coon MD   1 tablet at 07/26/16 0846  . nicotine (NICODERM CQ - dosed in mg/24 hours) patch 21 mg  21 mg Transdermal Daily SDustin Flock MD   21 mg at 07/26/16 1127  . ondansetron  (ZOFRAN) tablet 4 mg  4 mg Oral Q6H PRN DLance Coon MD       Or  . ondansetron ([facility] injection 4 mg  4 mg Intravenous Q6H PRN DLance Coon MD      .  piperacillin-tazobactam (ZOSYN) IVPB 4.5 g  4.5 g Intravenous Q8H Orbie Pyo, MD   4.5 g at 07/26/16 5465  . sodium chloride flush (NS) 0.9 % injection 3 mL  3 mL Intravenous Q12H Lance Coon, MD   3 mL at 07/26/16 0940  . sodium chloride flush (NS) 0.9 % injection 3 mL  3 mL Intravenous PRN Dustin Flock, MD   6 mL at 07/26/16 1201  . sodium chloride flush (NS) 0.9 % injection 3 mL  3 mL Intravenous Q12H Dustin Flock, MD   3 mL at 07/26/16 0939  . thiamine (VITAMIN B-1) tablet 100 mg  100 mg Oral Daily Lance Coon, MD   100 mg at 07/26/16 0847   Or  . thiamine (B-1) injection 100 mg  100 mg Intravenous Daily Lance Coon, MD      . vancomycin (VANCOCIN) IVPB 750 mg/150 ml premix  750 mg Intravenous Q12H Orbie Pyo, MD        Musculoskeletal: Strength & Muscle Tone: decreased Gait & Station: unsteady, ataxic, unable to stand Patient leans: Backward  Psychiatric Specialty Exam: Physical Exam  Nursing note and vitals reviewed. Constitutional: She appears cachectic. She is uncooperative. She appears distressed.  HENT:  Head: Normocephalic and atraumatic.  Eyes: Conjunctivae are normal. Pupils are equal, round, and reactive to light.  Neck: Normal range of motion.  Cardiovascular: Normal rate.   Respiratory: Effort normal. No respiratory distress.  GI: Soft.  Musculoskeletal: Normal range of motion.  Skin: Skin is warm and dry.  Psychiatric: Her affect is labile and inappropriate. Her speech is tangential. She is agitated. Cognition and memory are impaired. She expresses impulsivity. She is noncommunicative. She is inattentive.    Review of Systems  Unable to perform ROS: Mental status change    Blood pressure 112/76, pulse 90, temperature 98.2 F (36.8 C), temperature source Oral, resp. rate 20,  height _0  (1.626 m), weight 52.1 kg (114 lb 14.4 oz), last menstrual period 07/09/2016, SpO2 100 %.Body mass index is 19.72 kg/m.  General Appearance: Disheveled  Eye Contact:  Minimal  Speech:  Garbled and Slurred  Volume:  Decreased  Mood:  Mood is not really stated and varies from moment to moment in the appearance.  Affect:  Labile  Thought Process:  Disorganized and Irrelevant  Orientation:  Negative  Thought Content:  Illogical  Suicidal Thoughts:  Unknown but does not appear to be trying to hurt himself intentionally  Homicidal Thoughts:  Unknown but there is no evidence that she's trying to hurt anyone else intentionally  Memory:  Immediate;   Poor Recent;   Poor Remote;   Poor  Judgement:  Poor  Insight:  Lacking  Psychomotor Activity:  Increased and Restlessness  Concentration:  Concentration: Poor  Recall:  Poor  Fund of Knowledge:  Poor  Language:  Poor  Akathisia:  No  Handed:  Right  AIMS (if indicated):     Assets:  Social Support  ADL's:  Impaired  Cognition:  Impaired,  Moderate and Severe  Sleep:        Treatment Plan Summary: Daily contact with patient to assess and evaluate symptoms and progress in treatment, Medication management and Plan This is a 45 year old woman who is currently resenting with an agitated delirium. Her current symptom profile does not look at all typical for mania or a psychotic disorder or another typical "psychiatric" condition but rather for a medical delirium. Most common causes we see medical delirium and the hospital are  from infection and metabolic abnormalities particularly in the elderly, medication induced, and related to substance abuse. I do not have enough experience to know how likely it is that her current presentation could be the direct result of new infarcts. From my understanding her current behavior does not look typical for direct effect of seizure activity. I have a lot of concern about whether this could be related  to substance abuse or substance withdrawal. Her vital signs on last measurement however were normal. It is possible that a delirium this bad could come from prolonged benzodiazepine withdrawal. Less likely to be related to opiate withdrawal certainly could be consistent with alcohol withdrawal except normally we would expect more hemodynamic abnormalities and we don't have any history to suggest alcohol use. As noted by some previous caretakers the patient and her friend seemed to be probably minimizing her substance abuse history. There is evidence" here to strongly suggest a substance abuse problem that he is minimizing or denying. At this point however the acute mental health issue would be calming her down enough to avoid injury and allow for further workup and treatment. I have ordered 5 mg of intravenous Haldol now with when necessary Haldol to be provided every 2 hours as needed. I discussed this with nursing. Antipsychotics of this sort would usually be preferred for delirium although if some more part of this is withdrawal from benzodiazepines or alcohol there would be more of a role for benzodiazepines. She did get some IV Ativan here recently and the boyfriend notes that it seems to of made her worse. I might suggest although does beyond my scope of prescribing that if she stays agitated that Precedex in the intensive care unit might be a reasonable approach to avoid the risk of respiratory depression and allow for a more complete evaluation. Look forward to neurology input. I will follow up as needed please call me if I can be of help in the interim.  Disposition: Patient does not meet criteria for psychiatric inpatient admission.  Alethia Berthold, MD 07/26/2016 12:55 PM

## 2016-07-26 NOTE — Plan of Care (Signed)
Problem: Education: Goal: Knowledge of [facility] Education information/materials will improve Outcome: Progressing  Pt likes to be called Rachel Morrison  Past Medical History:  Diagnosis Date  . Anxiety   . H/O endocarditis   . H/O mitral valve replacement   . HLD (hyperlipidemia)     Pt is well controlled with home medications

## 2016-07-26 NOTE — Progress Notes (Addendum)
[facility] [place] [facility]                                                                                                                                                                                            Patient Demographics   Rachel Morrison, is a 45 y.o. female, DOB - 1971/12/05, VWU:[id number]  Admit date - 07/25/2016   Admitting Physician Oralia Manis, MD  Outpatient Primary MD for the patient is No PCP Per Patient   LOS - 1  Subjective:Since very agitated and screaming wants to go home Intermittent confusion     Review of Systems:   CONSTITUTIONAL: Agitated and unable to provide  Vitals:   Vitals:   07/25/16 2140 07/25/16 2310 07/26/16 0526 07/26/16 1316  BP: (!) 126/94 139/79 112/76 115/70  Pulse: 96 98 90 (!) 101  Resp: 16 18 20  (!) 22  Temp:  98.6 F (37 C) 98.2 F (36.8 C) 98.5 F (36.9 C)  TempSrc:  Oral Oral Oral  SpO2: 100% 100% 100% 100%  Weight:  52.1 kg (114 lb 14.4 oz)    Height:  5\' 4"  (1.626 m)      Wt Readings from Last 3 Encounters:  07/25/16 52.1 kg (114 lb 14.4 oz)     Intake/Output Summary (Last 24 hours) at 07/26/16 1504 Last data filed at 07/26/16 1500  Gross per 24 hour  Intake          2312.92 ml  Output             1025 ml  Net          1287.92 ml    Physical Exam:   GENERAL: Agitated.  HEAD, EYES, EARS, NOSE AND THROAT: Atraumatic, normocephalic. Extraocular muscles are intact. Pupils equal and reactive to light. Sclerae anicteric. No conjunctival injection. No oro-pharyngeal erythema.  NECK: Supple. There is no jugular venous distention. No bruits, no lymphadenopathy, no thyromegaly.  HEART: Regular rate and rhythm,. No murmurs, no rubs, no clicks.  LUNGS: Clear to auscultation bilaterally. No rales or rhonchi. No wheezes.  ABDOMEN: Soft, flat, nontender, nondistended. Has good bowel sounds. No hepatosplenomegaly appreciated.  EXTREMITIES: No evidence of any cyanosis, clubbing, or  peripheral edema.  +2 pedal and radial pulses bilaterally.  NEUROLOGIC: The patient is alert, agitated  SKIN: Moist and warm with no rashes appreciated.  Psych: Very anxious and agitated  LN: No inguinal LN enlargement    Antibiotics   Anti-infectives    Start     Dose/Rate Route Frequency Ordered Stop   07/27/16 1000  fluconazole (DIFLUCAN) IVPB 400 mg  400 mg 100 mL/hr over 120 Minutes Intravenous Every 24 hours 07/26/16 0039     07/26/16 1300  vancomycin (VANCOCIN) IVPB 750 mg/150 ml premix     750 mg 150 mL/hr over 60 Minutes Intravenous Every 12 hours 07/26/16 0258     07/26/16 0200  piperacillin-tazobactam (ZOSYN) IVPB 4.5 g     4.5 g 25 mL/hr over 240 Minutes Intravenous Every 8 hours 07/26/16 0103     07/25/16 2245  piperacillin-tazobactam (ZOSYN) IVPB 4.5 g  Status:  Discontinued     4.5 g 200 mL/hr over 30 Minutes Intravenous Every 8 hours 07/25/16 2232 07/26/16 0103   07/25/16 2230  fluconazole (DIFLUCAN) IVPB 800 mg     800 mg 200 mL/hr over 120 Minutes Intravenous  Once 07/25/16 2229 07/26/16 0221   07/25/16 2230  vancomycin (VANCOCIN) IVPB 1000 mg/200 mL premix     1,000 mg 200 mL/hr over 60 Minutes Intravenous  Once 07/25/16 2229 07/26/16 0300      Medications   Scheduled Meds: . DULoxetine  30 mg Oral Daily  . [START ON 07/27/2016] fluconazole (DIFLUCAN) IV  400 mg Intravenous Q24H  . folic acid  1 mg Oral Daily  . LORazepam  0-4 mg Intravenous Q6H   Followed by  . [START ON 07/27/2016] LORazepam  0-4 mg Intravenous Q12H  . multivitamin with minerals  1 tablet Oral Daily  . nicotine  21 mg Transdermal Daily  . piperacillin-tazobactam (ZOSYN)  IV  4.5 g Intravenous Q8H  . sodium chloride flush  3 mL Intravenous Q12H  . sodium chloride flush  3 mL Intravenous Q12H  . thiamine  100 mg Oral Daily   Or  . thiamine  100 mg Intravenous Daily  . vancomycin  750 mg Intravenous Q12H   Continuous Infusions: . heparin 900 Units/hr (07/26/16 0219)   PRN  Meds:.acetaminophen **OR** acetaminophen, haloperidol lactate, LORazepam **OR** LORazepam, morphine injection, ondansetron **OR** ondansetron (ZOFRAN) IV, sodium chloride flush   Data Review:   Micro Results Recent Results (from the past 240 hour(s))  Blood culture (routine x 2)     Status: None (Preliminary result)   Collection Time: 07/25/16  7:01 PM  Result Value Ref Range Status   Specimen Description BLOOD LEFT ARM  Final   Special Requests BOTTLES DRAWN AEROBIC AND ANAEROBIC [id number]  Final   Culture NO GROWTH < 12 HOURS  Final   Report Status PENDING  Incomplete  Blood culture (routine x 2)     Status: None (Preliminary result)   Collection Time: 07/25/16  7:01 PM  Result Value Ref Range Status   Specimen Description BLOOD LEFT ASSIST CONTROL  Final   Special Requests BOTTLES DRAWN AEROBIC AND ANAEROBIC [id number]  Final   Culture NO GROWTH < 12 HOURS  Final   Report Status PENDING  Incomplete  Blood culture (single)     Status: None (Preliminary result)   Collection Time: 07/25/16  8:34 PM  Result Value Ref Range Status   Specimen Description BLOOD LEFT FOREARM  Final   Special Requests BOTTLES DRAWN AEROBIC AND ANAEROBIC [id number]  Final   Culture NO GROWTH < 12 HOURS  Final   Report Status PENDING  Incomplete    Radiology Reports Dg Chest 1 View  Result Date: 07/25/2016 CLINICAL DATA:  Altered mental status. Recent mitral valve replacement. EXAM: CHEST 1 VIEW COMPARISON:  None. FINDINGS: Sternotomy wires appear aligned and intact. Mitral valve prosthesis is in place. Normal heart size. Normal mediastinal contour. No pneumothorax. No  pleural effusion. Lungs appear clear, with no acute consolidative airspace disease and no pulmonary edema. IMPRESSION: No active disease. Electronically Signed   By: Delbert PhenixJason A Poff M.D.   On: 07/25/2016 18:40   Ct Head Wo Contrast  Result Date: 07/25/2016 CLINICAL DATA:  Altered mental status. Anticoagulated on Coumadin for  recent mitral valve replacement. Confusion and hallucinations. EXAM: CT HEAD WITHOUT CONTRAST TECHNIQUE: Contiguous axial images were obtained from the base of the skull through the vertex without intravenous contrast. COMPARISON:  None. FINDINGS: There is a large focus of encephalomalacia in the medial right occipital lobe with associated ex vacuo dilatation of the occipital horn of the right lateral ventricle. There is a small focus of encephalomalacia in the left superior cerebellar hemisphere. There is a hypoechoic focus in the right thalamus. No evidence of parenchymal hemorrhage or extra-axial fluid collection. No mass lesion, mass effect, or midline shift. Otherwise no ventriculomegaly. The visualized paranasal sinuses are essentially clear. The mastoid air cells are unopacified. No evidence of calvarial fracture. IMPRESSION: 1. Large focus of encephalomalacia in the medial right occipital lobe with ex vacuo dilatation of the occipital horn of the right lateral ventricle, most suggestive of a chronic infarct. 2. Small focus of encephalomalacia in the left superior cerebellar hemisphere, most suggestive of a late subacute to chronic infarct. 3. Hypoechoic focus in the right thalamus, suggesting an infarct of uncertain chronicity, probably subacute to chronic. Correlate with brain MRI as clinically warranted. 4. No acute intracranial hemorrhage. No significant mass effect or midline shift. Electronically Signed   By: Delbert PhenixJason A Poff M.D.   On: 07/25/2016 18:50     CBC  Recent Labs Lab 07/25/16 1804 07/26/16 0037  WBC 7.5 6.2  HGB 10.6* 10.1*  HCT 32.3* 29.9*  PLT 325 300  MCV 78.9* 77.8*  MCH 25.8* 26.2  MCHC 32.7 33.6  RDW 18.8* 18.3*  LYMPHSABS 2.7  --   MONOABS 0.7  --   EOSABS 0.1  --   BASOSABS 0.0  --     Chemistries   Recent Labs Lab 07/25/16 1820 07/26/16 0037  NA 136 136  K 3.8 3.2*  CL 103 103  CO2 23 24  GLUCOSE 92 132*  BUN 19 17  CREATININE 0.84 0.86  CALCIUM 9.6  8.8*  AST 52*  --   ALT 28  --   ALKPHOS 109  --   BILITOT 0.5  --    ------------------------------------------------------------------------------------------------------------------ estimated creatinine clearance is 67.2 mL/min (by C-G formula based on SCr of 0.86 mg/dL). ------------------------------------------------------------------------------------------------------------------ No results for input(s): HGBA1C in the last 72 hours. ------------------------------------------------------------------------------------------------------------------ No results for input(s): CHOL, HDL, LDLCALC, TRIG, CHOLHDL, LDLDIRECT in the last 72 hours. ------------------------------------------------------------------------------------------------------------------ No results for input(s): TSH, T4TOTAL, T3FREE, THYROIDAB in the last 72 hours.  Invalid input(s): FREET3 ------------------------------------------------------------------------------------------------------------------ No results for input(s): VITAMINB12, FOLATE, FERRITIN, TIBC, IRON, RETICCTPCT in the last 72 hours.  Coagulation profile  Recent Labs Lab 07/25/16 1804  INR 1.82    No results for input(s): DDIMER in the last 72 hours.  Cardiac Enzymes  Recent Labs Lab 07/25/16 1804  TROPONINI 0.06*   ------------------------------------------------------------------------------------------------------------------ Invalid input(s): POCBNP    Assessment & Plan  Patient is a 45 year old with history of endocarditis with mechanical mitral valve replacement  1. Acute encephalopathy and agitation Possibly related to the abnormality noted on CT scan Continue Ciwa protocol I will add haloperidol to current regimen Suspect a lot of her symptoms may be psychiatric related I will ask psych to see Neurology evaluation  will be done as well  2. 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 Hypokalemia replace potassium  4. Anxiety disorder with acute exasperation psyche eval  5. History of endocarditis Appreciate ID input continue current antibiotics for now Continue empiric antibiotic with Zosyn and vancomycin        All the records are reviewed and case discussed with ED provider.     Code Status Orders        Start     Ordered   07/25/16 2320  Full code  Continuous     07/25/16 2319    Code Status History    Date Active Date Inactive Code Status Order ID Comments User Context   This patient has a current code status but no historical code status.           Consults Neurology, infectious disease, psychiatry DVT Prophylaxis heparin drip  Lab Results  Component Value Date   PLT 300 07/26/2016     Time Spent in minutes   Greater than 50% of time spent in care coordination and counseling patient regarding the condition and plan of care.   Auburn Bilberry M.D on 07/26/2016 at 3:04 PM  Between 7am to 6pm - Pager - [phone]  After 6pm go to [email or web address] - password [email or web address] [facility]  [facility] [place] [facility]   Office  [phone]

## 2016-07-26 NOTE — Progress Notes (Signed)
Pharmacy Antibiotic Note  Rachel GheeJanice Krahl is a 45 y.o. 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  Pharmacy has been consulted for vancomycin, Zosyn, and fluconazole dosing.  Plan: DW 52.6kg Vd 37L Kei 0.06 hr-1  T1/2 12 hours Vancomycin 750 mg q 12 hours ordered with stacked dosing. Level before 5th dose. Goal trough 15-20.  Zosyn 4.5 grams ordered for Pseudomonas risk of recent endocarditis episode in May.  Fluconazole 800 mg x1  ordered with 400 mg daily as continuation.   Height: 5\' 4"  (162.6 cm) Weight: 114 lb 14.4 oz (52.1 kg) IBW/kg (Calculated) : 54.7  Temp (24hrs), Avg:98.3 F (36.8 C), Min:98 F (36.7 C), Max:98.6 F (37 C)   Recent Labs Lab 07/25/16 1804 07/25/16 1820 07/25/16 2200 07/26/16 0037  WBC 7.5  --   --  6.2  CREATININE  --  0.84  --  0.86  LATICACIDVEN  --  2.0* 1.9  --     Estimated Creatinine Clearance: 67.2 mL/min (by C-G formula based on SCr of 0.86 mg/dL).    No Known Allergies  Antimicrobials this admission: vancomycin  >>  Zosyn  >>  Fluconazole >>  Dose adjustments this admission:   Microbiology results: 8/11 BCx: pending 8/11 UCx: pending    8/11 CXR: no active disease 8/11 UA: (-)  Thank you for allowing pharmacy to be a part of this patient's care.  Kenlee Vogt S 07/26/2016 1:41 AM

## 2016-07-27 ENCOUNTER — Inpatient Hospital Stay
Admit: 2016-07-27 | Discharge: 2016-07-27 | Disposition: A | Discharge status: Home / Self Care | Payer: Self-pay | Attending: Internal Medicine | Admitting: Internal Medicine

## 2016-07-27 LAB — BASIC METABOLIC PANEL
Anion gap: 5 (ref 5–15)
BUN: 8 mg/dL (ref 6–20)
CHLORIDE: 109 mmol/L (ref 101–111)
CO2: 25 mmol/L (ref 22–32)
CREATININE: 0.83 mg/dL (ref 0.44–1.00)
Calcium: 8.5 mg/dL — ABNORMAL LOW (ref 8.9–10.3)
GFR calc non Af Amer: >60 mL/min (ref >60)
Glucose, Bld: 131 mg/dL — ABNORMAL HIGH (ref 65–99)
Potassium: 3 mmol/L — ABNORMAL LOW (ref 3.5–5.1)
Sodium: 139 mmol/L (ref 135–145)

## 2016-07-27 LAB — CBC
HCT: 29.6 % — ABNORMAL LOW (ref 35.0–47.0)
Hemoglobin: 9.8 g/dL — ABNORMAL LOW (ref 12.0–16.0)
MCH: 26.1 pg (ref 26.0–34.0)
MCHC: 32.9 g/dL (ref 32.0–36.0)
MCV: 79.4 fL — AB (ref 80.0–100.0)
PLATELETS: 271 10*3/uL (ref 150–440)
RBC: 3.73 MIL/uL — AB (ref 3.80–5.20)
RDW: 18.3 % — ABNORMAL HIGH (ref 11.5–14.5)
WBC: 7.3 10*3/uL (ref 3.6–11.0)

## 2016-07-27 LAB — HEPARIN LEVEL (UNFRACTIONATED)
Heparin Unfractionated: 1.05 IU/mL — ABNORMAL HIGH (ref 0.30–0.70)
Heparin Unfractionated: 0.91 IU/mL — ABNORMAL HIGH (ref 0.30–0.70)
Heparin Unfractionated: 0.62 IU/mL (ref 0.30–0.70)

## 2016-07-27 LAB — URINE CULTURE

## 2016-07-27 MED ORDER — POTASSIUM CHLORIDE 10 MEQ/100ML IV SOLN
10.0000 meq | INTRAVENOUS | Status: AC
  Administered 2016-07-27 (×2): 10 meq via INTRAVENOUS
  Filled 2016-07-27 (×2): qty 100

## 2016-07-27 MED ORDER — HEPARIN (PORCINE) IN NACL 100-0.45 UNIT/ML-% IJ SOLN
850.0000 [IU]/h | INTRAMUSCULAR | Status: DC
  Administered 2016-07-28 – 2016-08-01 (×3): 850 [IU]/h via INTRAVENOUS
  Filled 2016-07-27 (×4): qty 250

## 2016-07-27 NOTE — Progress Notes (Signed)
Pharmacy Antibiotic Note  Rachel Morrison is a 45 y.o. 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  Pharmacy has been consulted for vancomycin, Zosyn, and fluconazole dosing.  Plan: Vancomycin 750 mg q 12 hours ordered with stacked dosing. Level before 5th dose. Goal trough 15-20.  Zosyn 4.5 grams ordered for Pseudomonas risk of recent endocarditis episode in May.  Fluconazole 800 mg x1  ordered with 400 mg daily as continuation.   Height: 5\' 4"  (162.6 cm) Weight: 116 lb 13.5 oz (53 kg) IBW/kg (Calculated) : 54.7  Temp (24hrs), Avg:98.1 F (36.7 C), Min:97.6 F (36.4 C), Max:98.7 F (37.1 C)   Recent Labs Lab 07/25/16 1804 07/25/16 1820 07/25/16 2200 07/26/16 0037 07/27/16 0623  WBC 7.5  --   --  6.2 7.3  CREATININE  --  0.84  --  0.86 0.83  LATICACIDVEN  --  2.0* 1.9  --   --     Estimated Creatinine Clearance: 70.9 mL/min (by C-G formula based on SCr of 0.83 mg/dL).    No Known Allergies  Antimicrobials this admission: vancomycin  8/12 >>  Zosyn 8/12 >>  Fluconazole 8/12 >>  Dose adjustments this admission:   Microbiology results: 8/12 MRSA PCR: negative 8/11 BCx: NGTD 8/11 UCx: pending    8/11 CXR: no active disease 8/11 UA: (-)  Thank you for allowing pharmacy to be a part of this patient's care.  Rachel Morrison, Rachel Morrison 07/27/2016 9:18 AM

## 2016-07-27 NOTE — Consult Note (Signed)
[facility] Consult   Reason for Consult:  Consult for this 45 year old woman who presented to the emergency room with confusion and agitation Referring Physician:  Posey Pronto Patient Identification: Rachel Morrison MRN:  [id number] Principal Diagnosis: Acute delirium Diagnosis:   Patient Active Problem List   Diagnosis Date Noted  . Acute delirium [R41.0] 07/26/2016  . 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 Acute cerebral infarction ([place]) [I63.9]   . Altered mental status [R41.82] 07/25/2016  . Brain lesion [G93.9] 07/25/2016  . Anxiety [F41.9] 07/25/2016  . H/O endocarditis [Z86.79] 07/25/2016  . Altered mental state [R41.82] 07/25/2016    Total Time spent with patient: 20 minutes  Subjective:    This is a follow-up on Saturday the 13th for this 44 year old woman with a history of substance abuse and multiple medical problems. Overnight the patient was transferred to the critical care unit to be placed on a Precedex drip for her delirium. On reevaluation this afternoon I find her in the critical care unit still on Precedex although the doses pretty low. She is awake and interactive. She was not oriented to where she was but was easy to redirect. She did not remember coming into the hospital. She did know that she had had surgery and had some understanding of her heart valve issues but otherwise was still a little confused. She denied having any hallucinations and does not appear to be having any visual hallucinations today. She is calm and has not been trying to crawl out of bed or getting agitated. Still denies being aware of any recent abuse of drugs that would've accounted for her delirium.  Rachel Morrison is a 45 y.o. female patient admitted with "it's [place]!".  HPI:  Patient interviewed. Also spoke with her significant other who is present in the room. Chart reviewed. I reviewed notes through care everywhere as well and also reviewed her data from the  controlled substance database. This 45 year old woman presented to the emergency room yesterday with confusion and agitation. According to her friend she became acutely agitated on Wednesday at which time she started to throw some confusion. This was more confused than normally but he settled her down and she got some sleep. All day Thursday and Friday however she has been much more confused and agitated. Yesterday apparently she tried to jump out of a moving car. Through a lot of belongings out of a car, seem to be frequently confused. He says that she has talked about going places and visiting people that do not make sense. This has been pretty consistent for the last couple days. Patient's friend denies that she had been abusing any drugs in the time since her heart surgery. He denies that she has been on any a new medicine recently and says he doesn't think there is any way she could've been taking any pain medicine. He says that she was given a prescription for a modest dose of Xanax recently. That bottle is not present among her belongings and he says that she threw it out the window. He does not think that she had overdosed on it. He does not think that she could've been using any alcohol. He says that as far as he know she had been otherwise cooperative with medications. 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 She has been on anticoagulation since then. It is a little unclear to me from the history how consistently she has been able to  keep up with all of her appointments. The friend reports that after the heart surgery she would occasionally have spells of acute confusion but that these would never last more than a few seconds at a time. Nothing lasted nearly as long as what she has right now. There is no indication that she had any actual suicidality or wish to die. She had not been having any other known psychiatric syndrome prior to this acute  confusion. On evaluation today the patient was unable to give any history herself. She was extremely confused and labile. She required constant intervention from nursing or from her friend to keep her from climbing out of bed. Her speech was nonsensical and her mental status waxed and waned as well as did her affect. Clearly disoriented.  Social history: Because of her acute agitation I was able to give only limited information. Apparently the patient and her friend had been living in the [place] area until fairly recently. I'm not sure if the reason they relocated up to this area was because of the heart surgery. In any case since being out of the hospital it sounds like they're living in the [place] area probably with some family members.  Medical history: History of endocarditis. 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 Outside of that however no other known chronic mental health problems. She had had brain scans at [facility] around the time that she was having the heart valve replaced. I'm not sure that I have the expertise to make a comparison between those and her most recent CT just based on the verbal descriptions of them. Nevertheless it's clear that she's had strokes over time.  Substance abuse history: Patient was not able to give any information her and her friend was not particularly forthcoming. Information I got from the old chart however suggests that it was known that she had problems with intravenous opiate abuse in the past. How recently this had been going on is unclear. Boyfriend reports that he doesn't think she's had any drug or alcohol problems any time since her surgery. It looks like she was being prescribed Xanax up to 3 times a day in the past. He says she was on that for years from her primary care doctor in [place]. He says that she never abused it and usually took less than the 3 mg a day. She did get a  prescription just a couple weeks ago at [facility] which is not currently present. Boyfriend denies ever having seen her delirious in a manner like this before in any context related to substance abuse.    Past Psychiatric History: She had apparently been prescribed 30 mg of Cymbalta at some point but according to him this was as part of a pain regimen. As far as he know she has not had any other psychiatric treatment. He is not aware of any history of suicidality or violence or psychotic disorder. Not aware of any psychiatric hospitalization. No evidence of any other psychoactive medicines except for the Cymbalta which looks like it has been empty for quite a while, Xanax which is not present, and a brief prednisone taper which should've run out several weeks ago.  Risk to Self: Is patient at risk for suicide?: No Risk to Others:   Prior Inpatient Therapy:   Prior Outpatient Therapy:    Past Medical History:  Past Medical History:  Diagnosis Date  . Anxiety   .  H/O endocarditis   . H/O mitral valve replacement   . HLD (hyperlipidemia)     Past Surgical History:  Procedure Laterality Date  . CARDIAC VALVE SURGERY     Family History:  Family History  Problem Relation Age of Onset  . Diabetes Mother   . Sleep apnea Mother   . Heart attack Father   . Hypertension Father    Family Psychiatric  History: Patient couldn't give any history. Boyfriend denies any knowledge of family history. Social History:  History  Alcohol Use No     History  Drug Use No    Social History   Social History  . Marital status: Married    Spouse name: N/A  . Number of children: N/A  . Years of education: N/A   Social History Main Topics  . Smoking status: Current Every Day Smoker    Packs/day: 1.50    Years: 30.00    Types: Cigarettes  . Smokeless tobacco: Never Used  . Alcohol use No  . Drug use: No  . Sexual activity: Not Asked   Other Topics Concern  . None   Social History Narrative  .  None   Additional Social History:    Allergies:  No Known Allergies  Labs:  Results for orders placed or performed during the hospital encounter of 07/25/16 (from the past 48 hour(s))  Ammonia     Status: None   Collection Time: 07/25/16  6:20 PM  Result Value Ref Range   Ammonia 13 9 - 35 umol/L  Lactic acid, plasma     Status: Abnormal   Collection Time: 07/25/16  6:20 PM  Result Value Ref Range   Lactic Acid, Venous 2.0 (HH) 0.5 - 1.9 mmol/L    Comment: CRITICAL RESULT CALLED TO, READ BACK BY AND VERIFIED WITH ANGELA ROBBINS AT 1857 ON 07/25/16 RWW   Comprehensive metabolic panel     Status: Abnormal   Collection Time: 07/25/16  6:20 PM  Result Value Ref Range   Sodium 136 135 - 145 mmol/L   Potassium 3.8 3.5 - 5.1 mmol/L   Chloride 103 101 - 111 mmol/L   CO2 23 22 - 32 mmol/L   Glucose, Bld 92 65 - 99 mg/dL   BUN 19 6 - 20 mg/dL   Creatinine, Ser 0.84 0.44 - 1.00 mg/dL   Calcium 9.6 8.9 - 10.3 mg/dL   Total Protein 8.2 (H) 6.5 - 8.1 g/dL   Albumin 4.2 3.5 - 5.0 g/dL   AST 52 (H) 15 - 41 U/L   ALT 28 14 - 54 U/L   Alkaline Phosphatase 109 38 - 126 U/L   Total Bilirubin 0.5 0.3 - 1.2 mg/dL   GFR calc non Af Amer >60 >60 mL/min   GFR calc Af Amer >60 >60 mL/min    Comment: (NOTE) The eGFR has been calculated using the CKD EPI equation. This calculation has not been validated in all clinical situations. eGFR's persistently <60 mL/min signify possible Chronic Kidney Disease.    Anion gap 10 5 - 15  Lipase, blood     Status: Abnormal   Collection Time: 07/25/16  6:20 PM  Result Value Ref Range   Lipase 65 (H) 11 - 51 U/L  Blood culture (routine x 2)     Status: None (Preliminary result)   Collection Time: 07/25/16  7:01 PM  Result Value Ref Range   Specimen Description BLOOD LEFT ARM    Special Requests BOTTLES DRAWN AEROBIC AND ANAEROBIC [place]  Culture NO GROWTH 2 DAYS    Report Status PENDING   Blood culture (routine x 2)     Status: None  (Preliminary result)   Collection Time: 07/25/16  7:01 PM  Result Value Ref Range   Specimen Description BLOOD LEFT ASSIST CONTROL    Special Requests BOTTLES DRAWN AEROBIC AND ANAEROBIC [id number]    Culture NO GROWTH 2 DAYS    Report Status PENDING   Urinalysis complete, with microscopic ([facility] only)     Status: Abnormal   Collection Time: 07/25/16  7:10 PM  Result Value Ref Range   Color, Urine STRAW (A) YELLOW   APPearance CLEAR (A) CLEAR   Glucose, UA NEGATIVE NEGATIVE mg/dL   Bilirubin Urine NEGATIVE NEGATIVE   Ketones, ur NEGATIVE NEGATIVE mg/dL   Specific Gravity, Urine 1.006 1.005 - 1.030   Hgb urine dipstick NEGATIVE NEGATIVE   pH 8.0 5.0 - 8.0   Protein, ur NEGATIVE NEGATIVE mg/dL   Nitrite NEGATIVE NEGATIVE   Leukocytes, UA NEGATIVE NEGATIVE   RBC / HPF NONE SEEN 0 - 5 RBC/hpf   WBC, UA 0-5 0 - 5 WBC/hpf   Bacteria, UA RARE (A) NONE SEEN   Squamous Epithelial / LPF 0-5 (A) NONE SEEN  Urine Drug Screen, Qualitative ([facility] only)     Status: Abnormal   Collection Time: 07/25/16  7:10 PM  Result Value Ref Range   Tricyclic, Ur Screen NONE DETECTED NONE DETECTED   Amphetamines, Ur Screen NONE DETECTED NONE DETECTED   MDMA (Ecstasy)Ur Screen NONE DETECTED NONE DETECTED   Cocaine Metabolite,Ur [place] NONE DETECTED NONE DETECTED   Opiate, Ur Screen NONE DETECTED NONE DETECTED   Phencyclidine (PCP) Ur S NONE DETECTED NONE DETECTED   Cannabinoid 50 Ng, Ur [place] NONE DETECTED NONE DETECTED   Barbiturates, Ur Screen NONE DETECTED NONE DETECTED   Benzodiazepine, Ur Scrn POSITIVE (A) NONE DETECTED   Methadone Scn, Ur NONE DETECTED NONE DETECTED    Comment: (NOTE) 017  Tricyclics, urine               Cutoff 1000 ng/mL 200  Amphetamines, urine             Cutoff 1000 ng/mL 300  MDMA (Ecstasy), urine           Cutoff 500 ng/mL 400  Cocaine Metabolite, urine       Cutoff 300 ng/mL 500  Opiate, urine                   Cutoff 300 ng/mL 600  Phencyclidine (PCP), urine      Cutoff 25  ng/mL 700  Cannabinoid, urine              Cutoff 50 ng/mL 800  Barbiturates, urine             Cutoff 200 ng/mL 900  Benzodiazepine, urine           Cutoff 200 ng/mL 1000 Methadone, urine                Cutoff 300 ng/mL 1100 1200 The urine drug screen provides only a preliminary, unconfirmed 1300 analytical test result and should not be used for non-medical 1400 purposes. Clinical consideration and professional judgment should 1500 be applied to any positive drug screen result due to possible 1600 interfering substances. A more specific alternate chemical method 1700 must be used in order to obtain a confirmed analytical result.  1800 Gas chromato graphy / mass spectrometry (GC/MS) is the preferred 1900  confirmatory method.   Pregnancy, urine POC     Status: None   Collection Time: 07/25/16  7:10 PM  Result Value Ref Range   Preg Test, Ur NEGATIVE NEGATIVE    Comment:        THE SENSITIVITY OF THIS METHODOLOGY IS >24 mIU/mL   Urine culture     Status: Abnormal   Collection Time: 07/25/16  7:10 PM  Result Value Ref Range   Specimen Description URINE, RANDOM    Special Requests NONE    Culture MULTIPLE SPECIES PRESENT, SUGGEST RECOLLECTION (A)    Report Status 07/27/2016 FINAL   Blood culture (single)     Status: None (Preliminary result)   Collection Time: 07/25/16  8:34 PM  Result Value Ref Range   Specimen Description BLOOD LEFT FOREARM    Special Requests BOTTLES DRAWN AEROBIC AND ANAEROBIC [id number]    Culture NO GROWTH 2 DAYS    Report Status PENDING   Lactic acid, plasma     Status: None   Collection Time: 07/25/16 10:00 PM  Result Value Ref Range   Lactic Acid, Venous 1.9 0.5 - 1.9 mmol/L  Basic metabolic panel     Status: Abnormal   Collection Time: 07/26/16 12:37 AM  Result Value Ref Range   Sodium 136 135 - 145 mmol/L   Potassium 3.2 (L) 3.5 - 5.1 mmol/L   Chloride 103 101 - 111 mmol/L   CO2 24 22 - 32 mmol/L   Glucose, Bld 132 (H) 65 - 99 mg/dL   BUN  17 6 - 20 mg/dL   Creatinine, Ser 0.86 0.44 - 1.00 mg/dL   Calcium 8.8 (L) 8.9 - 10.3 mg/dL   GFR calc non Af Amer >60 >60 mL/min   GFR calc Af Amer >60 >60 mL/min    Comment: (NOTE) The eGFR has been calculated using the CKD EPI equation. This calculation has not been validated in all clinical situations. eGFR's persistently <60 mL/min signify possible Chronic Kidney Disease.    Anion gap 9 5 - 15  CBC     Status: Abnormal   Collection Time: 07/26/16 12:37 AM  Result Value Ref Range   WBC 6.2 3.6 - 11.0 K/uL   RBC 3.85 3.80 - 5.20 MIL/uL   Hemoglobin 10.1 (L) 12.0 - 16.0 g/dL   HCT 29.9 (L) 35.0 - 47.0 %   MCV 77.8 (L) 80.0 - 100.0 fL   MCH 26.2 26.0 - 34.0 pg   MCHC 33.6 32.0 - 36.0 g/dL   RDW 18.3 (H) 11.5 - 14.5 %   Platelets 300 150 - 440 K/uL  APTT     Status: None   Collection Time: 07/26/16 12:37 AM  Result Value Ref Range   aPTT 36 24 - 36 seconds  Heparin level (unfractionated)     Status: None   Collection Time: 07/26/16  7:31 AM  Result Value Ref Range   Heparin Unfractionated 0.42 0.30 - 0.70 IU/mL    Comment:        IF HEPARIN RESULTS ARE BELOW EXPECTED VALUES, AND PATIENT DOSAGE HAS BEEN CONFIRMED, SUGGEST FOLLOW UP TESTING OF ANTITHROMBIN III LEVELS.   Heparin level (unfractionated)     Status: Abnormal   Collection Time: 07/26/16  2:18 PM  Result Value Ref Range   Heparin Unfractionated 0.17 (L) 0.30 - 0.70 IU/mL    Comment:        IF HEPARIN RESULTS ARE BELOW EXPECTED VALUES, AND PATIENT DOSAGE HAS BEEN CONFIRMED, SUGGEST FOLLOW UP TESTING OF  ANTITHROMBIN III LEVELS.   Heparin level (unfractionated)     Status: None   Collection Time: 07/26/16  9:33 PM  Result Value Ref Range   Heparin Unfractionated 0.53 0.30 - 0.70 IU/mL    Comment:        IF HEPARIN RESULTS ARE BELOW EXPECTED VALUES, AND PATIENT DOSAGE HAS BEEN CONFIRMED, SUGGEST FOLLOW UP TESTING OF ANTITHROMBIN III LEVELS.   Glucose, capillary     Status: Abnormal   Collection Time:  07/26/16 10:22 PM  Result Value Ref Range   Glucose-Capillary 107 (H) 65 - 99 mg/dL  MRSA PCR Screening     Status: None   Collection Time: 07/26/16 10:31 PM  Result Value Ref Range   MRSA by PCR NEGATIVE NEGATIVE    Comment:        The GeneXpert MRSA Assay (FDA approved for NASAL specimens only), is one component of a comprehensive MRSA colonization surveillance program. It is not intended to diagnose MRSA infection nor to guide or monitor treatment for MRSA infections.   CBC     Status: Abnormal   Collection Time: 07/27/16  6:23 AM  Result Value Ref Range   WBC 7.3 3.6 - 11.0 K/uL   RBC 3.73 (L) 3.80 - 5.20 MIL/uL   Hemoglobin 9.8 (L) 12.0 - 16.0 g/dL   HCT 29.6 (L) 35.0 - 47.0 %   MCV 79.4 (L) 80.0 - 100.0 fL   MCH 26.1 26.0 - 34.0 pg   MCHC 32.9 32.0 - 36.0 g/dL   RDW 18.3 (H) 11.5 - 14.5 %   Platelets 271 150 - 440 K/uL  Heparin level (unfractionated)     Status: Abnormal   Collection Time: 07/27/16  6:23 AM  Result Value Ref Range   Heparin Unfractionated 1.05 (H) 0.30 - 0.70 IU/mL    Comment:        IF HEPARIN RESULTS ARE BELOW EXPECTED VALUES, AND PATIENT DOSAGE HAS BEEN CONFIRMED, SUGGEST FOLLOW UP TESTING OF ANTITHROMBIN III LEVELS.   Basic metabolic panel     Status: Abnormal   Collection Time: 07/27/16  6:23 AM  Result Value Ref Range   Sodium 139 135 - 145 mmol/L   Potassium 3.0 (L) 3.5 - 5.1 mmol/L   Chloride 109 101 - 111 mmol/L   CO2 25 22 - 32 mmol/L   Glucose, Bld 131 (H) 65 - 99 mg/dL   BUN 8 6 - 20 mg/dL   Creatinine, Ser 0.83 0.44 - 1.00 mg/dL   Calcium 8.5 (L) 8.9 - 10.3 mg/dL   GFR calc non Af Amer >60 >60 mL/min   GFR calc Af Amer >60 >60 mL/min    Comment: (NOTE) The eGFR has been calculated using the CKD EPI equation. This calculation has not been validated in all clinical situations. eGFR's persistently <60 mL/min signify possible Chronic Kidney Disease.    Anion gap 5 5 - 15  Heparin level (unfractionated)     Status: Abnormal    Collection Time: 07/27/16  2:13 PM  Result Value Ref Range   Heparin Unfractionated 0.91 (H) 0.30 - 0.70 IU/mL    Comment:        IF HEPARIN RESULTS ARE BELOW EXPECTED VALUES, AND PATIENT DOSAGE HAS BEEN CONFIRMED, SUGGEST FOLLOW UP TESTING OF ANTITHROMBIN III LEVELS.     Current Facility-Administered Medications  Medication Dose Route Frequency Provider Last Rate Last Dose  . acetaminophen (TYLENOL) tablet 650 mg  650 mg Oral Q6H PRN Lance Coon, MD  Or  . acetaminophen (TYLENOL) suppository 650 mg  650 mg Rectal Q6H PRN Lance Coon, MD      . dexmedetomidine (PRECEDEX) 400 MCG/100ML (4 mcg/mL) infusion  0.4-1.2 mcg/kg/hr Intravenous Titrated Lytle Butte, MD 1.3 mL/hr at 07/27/16 1800 0.1 mcg/kg/hr at 07/27/16 1800  . DULoxetine (CYMBALTA) DR capsule 30 mg  30 mg Oral Daily Lance Coon, MD   30 mg at 07/26/16 0847  . fluconazole (DIFLUCAN) IVPB 400 mg  400 mg Intravenous Q24H Orbie Pyo, MD   400 mg at 07/27/16 0915  . folic acid (FOLVITE) tablet 1 mg  1 mg Oral Daily Lance Coon, MD   1 mg at 07/26/16 0846  . haloperidol lactate (HALDOL) injection 5 mg  5 mg Intravenous Q4H PRN Gonzella Lex, MD   5 mg at 07/26/16 1752  . heparin ADULT infusion 100 units/mL (25000 units/277m sodium chloride 0.45%)  850 Units/hr Intravenous Continuous SDustin Flock MD 8.5 mL/hr at 07/27/16 1800 850 Units/hr at 07/27/16 1800  . LORazepam (ATIVAN) injection 2 mg  2 mg Intravenous Q4H PRN JGonzella Lex MD      . morphine 2 MG/ML injection 2 mg  2 mg Intravenous Q4H PRN DLance Coon MD   2 mg at 07/27/16 0316  . multivitamin with minerals tablet 1 tablet  1 tablet Oral Daily DLance Coon MD   1 tablet at 07/26/16 0846  . nicotine (NICODERM CQ - dosed in mg/24 hours) patch 21 mg  21 mg Transdermal Daily SDustin Flock MD   21 mg at 07/27/16 0915  . ondansetron (ZOFRAN) tablet 4 mg  4 mg Oral Q6H PRN DLance Coon MD       Or  . ondansetron ([facility] injection 4 mg  4 mg  Intravenous Q6H PRN DLance Coon MD      . piperacillin-tazobactam (ZOSYN) IVPB 4.5 g  4.5 g Intravenous Q8H DOrbie Pyo MD   4.5 g at 07/27/16 1711  . sodium chloride flush (NS) 0.9 % injection 3 mL  3 mL Intravenous Q12H DLance Coon MD   3 mL at 07/27/16 0917  . sodium chloride flush (NS) 0.9 % injection 3 mL  3 mL Intravenous PRN SDustin Flock MD   3 mL at 07/26/16 1754  . sodium chloride flush (NS) 0.9 % injection 3 mL  3 mL Intravenous Q12H SDustin Flock MD   3 mL at 07/27/16 0917  . thiamine (VITAMIN B-1) tablet 100 mg  100 mg Oral Daily DLance Coon MD   100 mg at 07/26/16 0847   Or  . thiamine (B-1) injection 100 mg  100 mg Intravenous Daily DLance Coon MD   100 mg at 07/27/16 0916  . vancomycin (VANCOCIN) IVPB 750 mg/150 ml premix  750 mg Intravenous Q12H DOrbie Pyo MD   750 mg at 07/27/16 1326    Musculoskeletal: Strength & Muscle Tone: decreased Gait & Station: unsteady, ataxic, unable to stand Patient leans: Backward  Psychiatric Specialty Exam: Physical Exam  Nursing note and vitals reviewed. Constitutional: She appears well-developed and well-nourished. She appears cachectic. She is uncooperative. No distress.  HENT:  Head: Normocephalic and atraumatic.  Eyes: Conjunctivae are normal. Pupils are equal, round, and reactive to light.  Neck: Normal range of motion.  Cardiovascular: Normal rate and normal heart sounds.   Respiratory: Effort normal. No respiratory distress.  GI: Soft.  Musculoskeletal: Normal range of motion.  Neurological: She is alert.  Skin: Skin is warm and dry.  Psychiatric: Her affect  is not labile and not inappropriate. Her speech is tangential. She is not agitated. Cognition and memory are impaired. She does not express impulsivity. She is communicative. She is inattentive.    Review of Systems  Unable to perform ROS: Mental status change    Blood pressure 136/86, pulse 76, temperature 97.5 F (36.4 C),  temperature source Axillary, resp. rate (!) 25, height 5' 4"  (1.626 m), weight 53 kg (116 lb 13.5 oz), last menstrual period 07/09/2016, SpO2 100 %.Body mass index is 20.06 kg/m.  General Appearance: Disheveled  Eye Contact:  Minimal  Speech:  Garbled and Slurred  Volume:  Decreased  Mood:  Mood is not really stated and varies from moment to moment in the appearance.  Affect:  Labile  Thought Process:  Disorganized and Irrelevant  Orientation:  Negative  Thought Content:  Illogical  Suicidal Thoughts:  Unknown but does not appear to be trying to hurt himself intentionally  Homicidal Thoughts:  Unknown but there is no evidence that she's trying to hurt anyone else intentionally  Memory:  Immediate;   Poor Recent;   Poor Remote;   Poor  Judgement:  Poor  Insight:  Lacking  Psychomotor Activity:  Increased and Restlessness  Concentration:  Concentration: Poor  Recall:  Poor  Fund of Knowledge:  Poor  Language:  Poor  Akathisia:  No  Handed:  Right  AIMS (if indicated):     Assets:  Social Support  ADL's:  Impaired  Cognition:  Impaired,  Moderate and Severe  Sleep:        Treatment Plan Summary: Daily contact with patient to assess and evaluate symptoms and progress in treatment, Medication management and Plan Much calmer. I'm glad that she got put on a Precedex drip. The level is low enough now that I would hope that he can be discontinued over the next few hours and maybe she will not go back into the delirium she had before. Psychiatrically at this point there is no indication for any other medication. I will just continue to follow-up and see how she goes once she is back on the medical floor. Gave her some education about her current status. Patient has no new questions.  Disposition: Patient does not meet criteria for psychiatric inpatient admission.  Alethia Berthold, MD 07/27/2016 6:07 PM

## 2016-07-27 NOTE — Progress Notes (Signed)
ANTICOAGULATION CONSULT NOTE - Initial Consult  Pharmacy Consult for heparin drip Indication: mechanical mitral valve  No Known Allergies  Patient Measurements: Height: 5\' 4"  (162.6 cm) Weight: 116 lb 13.5 oz (53 kg) IBW/kg (Calculated) : 54.7 Heparin Dosing Weight: 52 kg  Vital Signs: Temp: 97.6 F (36.4 C) (08/13 1200) Temp Source: Axillary (08/13 1200) BP: 145/108 (08/13 1400) Pulse Rate: 59 (08/13 1400)  Labs:  Recent Labs  07/25/16 1804 07/25/16 1820 07/26/16 0037  07/26/16 2133 07/27/16 0623 07/27/16 1413  HGB 10.6*  --  10.1*  --   --  9.8*  --   HCT 32.3*  --  29.9*  --   --  29.6*  --   PLT 325  --  300  --   --  271  --   APTT  --   --  36  --   --   --   --   LABPROT 21.3*  --   --   --   --   --   --   INR 1.82  --   --   --   --   --   --   HEPARINUNFRC  --   --   --   < > 0.53 1.05* 0.91*  CREATININE  --  0.84 0.86  --   --  0.83  --   TROPONINI 0.06*  --   --   --   --   --   --   < > = values in this interval not displayed.  Estimated Creatinine Clearance: 70.9 mL/min (by C-G formula based on SCr of 0.83 mg/dL).   Medical History: Past Medical History:  Diagnosis Date  . Anxiety   . H/O endocarditis   . H/O mitral valve replacement   . HLD (hyperlipidemia)     Medications:  On warfarin as outpatient.   Assessment: INR subtherapeutic on admission.   Goal of Therapy:  Heparin level 0.3-0.7 units/ml Monitor platelets by anticoagulation protocol: Yes   Plan:  Heparin level= 0.91. Will decrease heparin infusion to 850 units/hr and recheck a HL in 6 hours.   Luisa Harthristy, Vikrant Pryce D 07/27/2016,2:59 PM

## 2016-07-27 NOTE — Progress Notes (Signed)
ANTICOAGULATION CONSULT NOTE - Initial Consult  Pharmacy Consult for heparin drip Indication: mechanical mitral valve  No Known Allergies  Patient Measurements: Height: 5\' 4"  (162.6 cm) Weight: 116 lb 13.5 oz (53 kg) IBW/kg (Calculated) : 54.7 Heparin Dosing Weight: 52 kg  Vital Signs: Temp: 98.9 F (37.2 C) (08/13 2000) Temp Source: Oral (08/13 2000) BP: 136/86 (08/13 1800) Pulse Rate: 76 (08/13 1800)  Labs:  Recent Labs  07/25/16 1804 07/25/16 1820 07/26/16 0037  07/27/16 0623 07/27/16 1413 07/27/16 2111  HGB 10.6*  --  10.1*  --  9.8*  --   --   HCT 32.3*  --  29.9*  --  29.6*  --   --   PLT 325  --  300  --  271  --   --   APTT  --   --  36  --   --   --   --   LABPROT 21.3*  --   --   --   --   --   --   INR 1.82  --   --   --   --   --   --   HEPARINUNFRC  --   --   --   < > 1.05* 0.91* 0.62  CREATININE  --  0.84 0.86  --  0.83  --   --   TROPONINI 0.06*  --   --   --   --   --   --   < > = values in this interval not displayed.  Estimated Creatinine Clearance: 70.9 mL/min (by C-G formula based on SCr of 0.83 mg/dL).   Medical History: Past Medical History:  Diagnosis Date  . Anxiety   . H/O endocarditis   . H/O mitral valve replacement   . HLD (hyperlipidemia)     Medications:  On warfarin as outpatient.   Assessment: INR subtherapeutic on admission.   Goal of Therapy:  Heparin level 0.3-0.7 units/ml Monitor platelets by anticoagulation protocol: Yes   Plan:  Heparin level= 0.62. Will continue heparin infusion at 850 units/hr and order confirmatory  HL in 6 hours.   Cindi CarbonMary M Novalie Leamy, PharmD 07/27/2016,9:51 PM

## 2016-07-27 NOTE — Progress Notes (Signed)
[facility] -  at [facility]                                                                                                                                                                                            Patient Demographics   Rachel Morrison, is a 45 y.o. female, DOB - [birth year], ZOX:[id number]  Admit date - 07/25/2016   Admitting Physician Oralia Manis, MD  Outpatient Primary MD for the patient is No PCP Per Patient   LOS - 2  Subjective:Patient became more agitated last night and had to be transferred to the ICU and on Precedex drip. Currently sedated and unable to provide any review of system    Review of Systems:   CONSTITUTIONAL: Agitated and unable to provide  Vitals:   Vitals:   07/27/16 0500 07/27/16 0600 07/27/16 0700 07/27/16 0749  BP: 131/88 (!) 138/92 136/86   Pulse: 66 66 65 66  Resp: Temp:    97.6 F (36.4 C)  TempSrc:    Axillary  SpO2: 100% 100% 100% 100%  Weight:      Height:        Wt Readings from Last 3 Encounters:  07/26/16 53 kg (116 lb 13.5 oz)     Intake/Output Summary (Last 24 hours) at 07/27/16 8119 Last data filed at 07/27/16 0700  Gross per 24 hour  Intake           816.89 ml  Output              975 ml  Net          -158.11 ml    Physical Exam:   GENERAL: Sedated HEAD, EYES, EARS, NOSE AND THROAT: Atraumatic, normocephalic. Extraocular muscles are intact. Pupils equal and reactive to light. Sclerae anicteric. No conjunctival injection. No oro-pharyngeal erythema.  NECK: Supple. There is no jugular venous distention. No bruits, no lymphadenopathy, no thyromegaly.  HEART: Regular rate and rhythm,. No murmurs, no rubs, no clicks.  LUNGS: Clear to auscultation bilaterally. No rales or rhonchi. No wheezes.  ABDOMEN: Soft, flat, nontender, nondistended. Has good bowel sounds. No hepatosplenomegaly appreciated.  EXTREMITIES: No evidence of any cyanosis, clubbing, or peripheral edema.   +2 pedal and radial pulses bilaterally.  NEUROLOGIC: The patient is sedated  SKIN: Moist and warm with no rashes appreciated.  Psych: Sedated LN: No inguinal LN enlargement    Antibiotics   Anti-infectives    Start     Dose/Rate Route Frequency Ordered Stop   07/27/16 1000  fluconazole (DIFLUCAN) IVPB 400 mg     400 mg 100  mL/hr over 120 Minutes Intravenous Every 24 hours 07/26/16 0039     07/26/16 1300  vancomycin (VANCOCIN) IVPB 750 mg/150 ml premix     750 mg 150 mL/hr over 60 Minutes Intravenous Every 12 hours 07/26/16 0258     07/26/16 0200  piperacillin-tazobactam (ZOSYN) IVPB 4.5 g     4.5 g 25 mL/hr over 240 Minutes Intravenous Every 8 hours 07/26/16 0103     07/25/16 2245  piperacillin-tazobactam (ZOSYN) IVPB 4.5 g  Status:  Discontinued     4.5 g 200 mL/hr over 30 Minutes Intravenous Every 8 hours 07/25/16 2232 07/26/16 0103   07/25/16 2230  fluconazole (DIFLUCAN) IVPB 800 mg     800 mg 200 mL/hr over 120 Minutes Intravenous  Once 07/25/16 2229 07/26/16 0221   07/25/16 2230  vancomycin (VANCOCIN) IVPB 1000 mg/200 mL premix     1,000 mg 200 mL/hr over 60 Minutes Intravenous  Once 07/25/16 2229 07/26/16 0300      Medications   Scheduled Meds: . DULoxetine  30 mg Oral Daily  . fluconazole (DIFLUCAN) IV  400 mg Intravenous Q24H  . folic acid  1 mg Oral Daily  . multivitamin with minerals  1 tablet Oral Daily  . nicotine  21 mg Transdermal Daily  . piperacillin-tazobactam (ZOSYN)  IV  4.5 g Intravenous Q8H  . sodium chloride flush  3 mL Intravenous Q12H  . sodium chloride flush  3 mL Intravenous Q12H  . thiamine  100 mg Oral Daily   Or  . thiamine  100 mg Intravenous Daily  . vancomycin  750 mg Intravenous Q12H   Continuous Infusions: . dexmedetomidine 0.699 mcg/kg/hr (07/27/16 0700)  . heparin     PRN Meds:.acetaminophen **OR** acetaminophen, haloperidol lactate, LORazepam, morphine injection, ondansetron **OR** ondansetron (ZOFRAN) IV, sodium chloride  flush   Data Review:   Micro Results Recent Results (from the past 240 hour(s))  Blood culture (routine x 2)     Status: None (Preliminary result)   Collection Time: 07/25/16  7:01 PM  Result Value Ref Range Status   Specimen Description BLOOD LEFT ARM  Final   Special Requests BOTTLES DRAWN AEROBIC AND ANAEROBIC 7CCAERO,7CCANA  Final   Culture NO GROWTH < 12 HOURS  Final   Report Status PENDING  Incomplete  Blood culture (routine x 2)     Status: None (Preliminary result)   Collection Time: 07/25/16  7:01 PM  Result Value Ref Range Status   Specimen Description BLOOD LEFT ASSIST CONTROL  Final   Special Requests BOTTLES DRAWN AEROBIC AND ANAEROBIC [id number],5CCANA  Final   Culture NO GROWTH < 12 HOURS  Final   Report Status PENDING  Incomplete  Blood culture (single)     Status: None (Preliminary result)   Collection Time: 07/25/16  8:34 PM  Result Value Ref Range Status   Specimen Description BLOOD LEFT FOREARM  Final   Special Requests BOTTLES DRAWN AEROBIC AND ANAEROBIC [id number],8CCANA  Final   Culture NO GROWTH < 12 HOURS  Final   Report Status PENDING  Incomplete  MRSA PCR Screening     Status: None   Collection Time: 07/26/16 10:31 PM  Result Value Ref Range Status   MRSA by PCR NEGATIVE NEGATIVE Final    Comment:        The GeneXpert MRSA Assay (FDA approved for NASAL specimens only), is one component of a comprehensive MRSA colonization surveillance program. It is not intended to diagnose MRSA infection nor to guide or monitor treatment for MRSA  infections.     Radiology Reports Dg Chest 1 View  Result Date: 07/25/2016 CLINICAL DATA:  Altered mental status. Recent mitral valve replacement. EXAM: CHEST 1 VIEW COMPARISON:  None. FINDINGS: Sternotomy wires appear aligned and intact. Mitral valve prosthesis is in place. Normal heart size. Normal mediastinal contour. No pneumothorax. No pleural effusion. Lungs appear clear, with no acute consolidative airspace disease  and no pulmonary edema. IMPRESSION: No active disease. Electronically Signed   By: Delbert Phenix M.D.   On: 07/25/2016 18:40   Ct Head Wo Contrast  Result Date: 07/25/2016 CLINICAL DATA:  Altered mental status. Anticoagulated on Coumadin for recent mitral valve replacement. Confusion and hallucinations. EXAM: CT HEAD WITHOUT CONTRAST TECHNIQUE: Contiguous axial images were obtained from the base of the skull through the vertex without intravenous contrast. COMPARISON:  None. FINDINGS: There is a large focus of encephalomalacia in the medial right occipital lobe with associated ex vacuo dilatation of the occipital horn of the right lateral ventricle. There is a small focus of encephalomalacia in the left superior cerebellar hemisphere. There is a hypoechoic focus in the right thalamus. No evidence of parenchymal hemorrhage or extra-axial fluid collection. No mass lesion, mass effect, or midline shift. Otherwise no ventriculomegaly. The visualized paranasal sinuses are essentially clear. The mastoid air cells are unopacified. No evidence of calvarial fracture. IMPRESSION: 1. Large focus of encephalomalacia in the medial right occipital lobe with ex vacuo dilatation of the occipital horn of the right lateral ventricle, most suggestive of a chronic infarct. 2. Small focus of encephalomalacia in the left superior cerebellar hemisphere, most suggestive of a late subacute to chronic infarct. 3. Hypoechoic focus in the right thalamus, suggesting an infarct of uncertain chronicity, probably subacute to chronic. Correlate with brain MRI as clinically warranted. 4. No acute intracranial hemorrhage. No significant mass effect or midline shift. Electronically Signed   By: Delbert Phenix M.D.   On: 07/25/2016 18:50     CBC  Recent Labs Lab 07/25/16 1804 07/26/16 0037 07/27/16 0623  WBC 7.5 6.2 7.3  HGB 10.6* 10.1* 9.8*  HCT 32.3* 29.9* 29.6*  PLT 325 300 271  MCV 78.9* 77.8* 79.4*  MCH 25.8* 26.2 26.1  MCHC 32.7  33.6 32.9  RDW 18.8* 18.3* 18.3*  LYMPHSABS 2.7  --   --   MONOABS 0.7  --   --   EOSABS 0.1  --   --   BASOSABS 0.0  --   --     Chemistries   Recent Labs Lab 07/25/16 1820 07/26/16 0037  NA 136 136  K 3.8 3.2*  CL 103 103  CO2 23 24  GLUCOSE 92 132*  BUN 19 17  CREATININE 0.84 0.86  CALCIUM 9.6 8.8*  AST 52*  --   ALT 28  --   ALKPHOS 109  --   BILITOT 0.5  --    ------------------------------------------------------------------------------------------------------------------ estimated creatinine clearance is 68.4 mL/min (by C-G formula based on SCr of 0.86 mg/dL). ------------------------------------------------------------------------------------------------------------------ No results for input(s): HGBA1C in the last 72 hours. ------------------------------------------------------------------------------------------------------------------ No results for input(s): CHOL, HDL, LDLCALC, TRIG, CHOLHDL, LDLDIRECT in the last 72 hours. ------------------------------------------------------------------------------------------------------------------ No results for input(s): TSH, T4TOTAL, T3FREE, THYROIDAB in the last 72 hours.  Invalid input(s): FREET3 ------------------------------------------------------------------------------------------------------------------ No results for input(s): VITAMINB12, FOLATE, FERRITIN, TIBC, IRON, RETICCTPCT in the last 72 hours.  Coagulation profile  Recent Labs Lab 07/25/16 1804  INR 1.82    No results for input(s): DDIMER in the last 72 hours.  Cardiac Enzymes  Recent  Labs Lab 07/25/16 1804  TROPONINI 0.06*   ------------------------------------------------------------------------------------------------------------------ Invalid input(s): POCBNP    Assessment & Plan  Patient is a 45 year old with history of endocarditis with mechanical mitral valve replacement  1. Acute encephalopathy and agitation Suspect related  to possible benzo withdrawal Unable to have MRI due to mechanical valve Appreciate neurology input Continue Precedex Suspect a lot of her symptoms may be psychiatric related appreciate psych input  2. 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 Hypokalemia replace potassium Repeat BMP this morning  4. Anxiety disorder with acute exasperation psyche eval  5. History of endocarditis Appreciate ID input patient afebrile Blood cultures negative Was previously on antifungal medications For now I will continue antibiotics will discuss with ID regarding discontinuing of Vanco and Zosyn       All the records are reviewed and case discussed with ED provider.     Code Status Orders        Start     Ordered   07/25/16 2320  Full code  Continuous     07/25/16 2319    Code Status History    Date Active Date Inactive Code Status Order ID Comments User Context   This patient has a current code status but no historical code status.           Consults Neurology, infectious disease, psychiatry DVT Prophylaxis heparin drip  Lab Results  Component Value Date   PLT 271 07/27/2016     Time Spent in minutes  ill  .   Auburn Bilberry M.D on 07/27/2016 at 8:07 AM  Between 7am to 6pm - Pager - [phone]  After 6pm go to [email or web address] - password [email or web address] [facility]  [facility]   Office  [phone]

## 2016-07-27 NOTE — Progress Notes (Signed)
Per Dr Allena KatzPatel, insert foley catheter for urinary retention > 999 on bladder scanner.  Dr will order potassium replacement for K 3.0

## 2016-07-27 NOTE — Progress Notes (Signed)
*  PRELIMINARY RESULTS* °Echocardiogram °2D Echocardiogram has been performed. ° °Rachel Morrison °07/27/2016, 12:12 PM °

## 2016-07-27 NOTE — Progress Notes (Signed)
Subjective: Patient resting, on Precedex.  Did not respond well to Haldol.    Objective: Current vital signs: BP (!) 136/98   Pulse 61   Temp 97.6 F (36.4 C) (Axillary)   Resp 18   Ht 5\' 4"  (1.626 m)   Wt 53 kg (116 lb 13.5 oz)   LMP 07/09/2016 (Approximate)   SpO2 100%   BMI 20.06 kg/m  Vital signs in last 24 hours: Temp:  [97.6 F (36.4 C)-98.7 F (37.1 C)] 97.6 F (36.4 C) (08/13 0749) Pulse Rate:  [61-101] 61 (08/13 1100) Resp:  [15-28] 18 (08/13 1100) BP: (115-149)/(68-105) 136/98 (08/13 1130) SpO2:  [99 %-100 %] 100 % (08/13 1100) Weight:  [53 kg (116 lb 13.5 oz)] 53 kg (116 lb 13.5 oz) (08/12 2242)  Intake/Output from previous day: 08/12 0701 - 08/13 0700 In: 1583.1 [P.O.:150; I.V.:1033.1; IV Piggyback:400] Out: 1125 [Urine:1125] Intake/Output this shift: Total I/O In: 46.3 [I.V.:46.3] Out: -  Nutritional status: Diet regular Room service appropriate? Yes; Fluid consistency: Thin  Neurologic Exam: Not evaluated due to patient getting rest.  Reportedly less agitated on awakening earlier.    Lab Results: Basic Metabolic Panel:  Recent Labs Lab 07/25/16 1820 07/26/16 0037 07/27/16 0623  NA 136 136 139  K 3.8 3.2* 3.0*  CL 103 103 109  CO2 23 24 25   GLUCOSE 92 132* 131*  BUN 19 17 8   CREATININE 0.84 0.86 0.83  CALCIUM 9.6 8.8* 8.5*    Liver Function Tests:  Recent Labs Lab 07/25/16 1820  AST 52*  ALT 28  ALKPHOS 109  BILITOT 0.5  PROT 8.2*  ALBUMIN 4.2    Recent Labs Lab 07/25/16 1820  LIPASE 65*    Recent Labs Lab 07/25/16 1820  AMMONIA 13    CBC:  Recent Labs Lab 07/25/16 1804 07/26/16 0037 07/27/16 0623  WBC 7.5 6.2 7.3  NEUTROABS 3.9  --   --   HGB 10.6* 10.1* 9.8*  HCT 32.3* 29.9* 29.6*  MCV 78.9* 77.8* 79.4*  PLT 325 300 271    Cardiac Enzymes:  Recent Labs Lab 07/25/16 1804  TROPONINI 0.06*    Lipid Panel: No results for input(s): CHOL, TRIG, HDL, CHOLHDL, VLDL, LDLCALC in the last 168  hours.  CBG:  Recent Labs Lab 07/26/16 2222  GLUCAP 107*    Microbiology: Results for orders placed or performed during the hospital encounter of 07/25/16  Blood culture (routine x 2)     Status: None (Preliminary result)   Collection Time: 07/25/16  7:01 PM  Result Value Ref Range Status   Specimen Description BLOOD LEFT ARM  Final   Special Requests BOTTLES DRAWN AEROBIC AND ANAEROBIC 7CCAERO,7CCANA  Final   Culture NO GROWTH < 12 HOURS  Final   Report Status PENDING  Incomplete  Blood culture (routine x 2)     Status: None (Preliminary result)   Collection Time: 07/25/16  7:01 PM  Result Value Ref Range Status   Specimen Description BLOOD LEFT ASSIST CONTROL  Final   Special Requests BOTTLES DRAWN AEROBIC AND ANAEROBIC 7CCAERO,5CCANA  Final   Culture NO GROWTH < 12 HOURS  Final   Report Status PENDING  Incomplete  Urine culture     Status: Abnormal   Collection Time: 07/25/16  7:10 PM  Result Value Ref Range Status   Specimen Description URINE, RANDOM  Final   Special Requests NONE  Final   Culture MULTIPLE SPECIES PRESENT, SUGGEST RECOLLECTION (A)  Final   Report Status 07/27/2016 FINAL  Final  Blood culture (single)     Status: None (Preliminary result)   Collection Time: 07/25/16  8:34 PM  Result Value Ref Range Status   Specimen Description BLOOD LEFT FOREARM  Final   Special Requests BOTTLES DRAWN AEROBIC AND ANAEROBIC [id number],8CCANA  Final   Culture NO GROWTH < 12 HOURS  Final   Report Status PENDING  Incomplete  MRSA PCR Screening     Status: None   Collection Time: 07/26/16 10:31 PM  Result Value Ref Range Status   MRSA by PCR NEGATIVE NEGATIVE Final    Comment:        The GeneXpert MRSA Assay (FDA approved for NASAL specimens only), is one component of a comprehensive MRSA colonization surveillance program. It is not intended to diagnose MRSA infection nor to guide or monitor treatment for MRSA infections.     Coagulation Studies:  Recent Labs   07/25/16 1804  LABPROT 21.3*  INR 1.82    Imaging: Dg Chest 1 View  Result Date: 07/25/2016 CLINICAL DATA:  Altered mental status. Recent mitral valve replacement. EXAM: CHEST 1 VIEW COMPARISON:  None. FINDINGS: Sternotomy wires appear aligned and intact. Mitral valve prosthesis is in place. Normal heart size. Normal mediastinal contour. No pneumothorax. No pleural effusion. Lungs appear clear, with no acute consolidative airspace disease and no pulmonary edema. IMPRESSION: No active disease. Electronically Signed   By: Delbert Phenix M.D.   On: 07/25/2016 18:40   Ct Head Wo Contrast  Result Date: 07/25/2016 CLINICAL DATA:  Altered mental status. Anticoagulated on Coumadin for recent mitral valve replacement. Confusion and hallucinations. EXAM: CT HEAD WITHOUT CONTRAST TECHNIQUE: Contiguous axial images were obtained from the base of the skull through the vertex without intravenous contrast. COMPARISON:  None. FINDINGS: There is a large focus of encephalomalacia in the medial right occipital lobe with associated ex vacuo dilatation of the occipital horn of the right lateral ventricle. There is a small focus of encephalomalacia in the left superior cerebellar hemisphere. There is a hypoechoic focus in the right thalamus. No evidence of parenchymal hemorrhage or extra-axial fluid collection. No mass lesion, mass effect, or midline shift. Otherwise no ventriculomegaly. The visualized paranasal sinuses are essentially clear. The mastoid air cells are unopacified. No evidence of calvarial fracture. IMPRESSION: 1. Large focus of encephalomalacia in the medial right occipital lobe with ex vacuo dilatation of the occipital horn of the right lateral ventricle, most suggestive of a chronic infarct. 2. Small focus of encephalomalacia in the left superior cerebellar hemisphere, most suggestive of a late subacute to chronic infarct. 3. Hypoechoic focus in the right thalamus, suggesting an infarct of uncertain  chronicity, probably subacute to chronic. Correlate with brain MRI as clinically warranted. 4. No acute intracranial hemorrhage. No significant mass effect or midline shift. Electronically Signed   By: Delbert Phenix M.D.   On: 07/25/2016 18:50    Medications:  I have reviewed the patient's current medications. Scheduled: . DULoxetine  30 mg Oral Daily  . fluconazole (DIFLUCAN) IV  400 mg Intravenous Q24H  . folic acid  1 mg Oral Daily  . multivitamin with minerals  1 tablet Oral Daily  . nicotine  21 mg Transdermal Daily  . piperacillin-tazobactam (ZOSYN)  IV  4.5 g Intravenous Q8H  . potassium chloride  10 mEq Intravenous Q1 Hr x 2  . sodium chloride flush  3 mL Intravenous Q12H  . sodium chloride flush  3 mL Intravenous Q12H  . thiamine  100 mg Oral Daily  Or  . thiamine  100 mg Intravenous Daily  . vancomycin  750 mg Intravenous Q12H    Assessment/Plan: Patient less agitated on Precedex.  Once appropriate would obtain MRI of the brain.  Agree with cardiology involvement.     LOS: 2 days   Thana FarrLeslie Barbaraann Avans, MD Neurology [phone] 07/27/2016  11:50 AM

## 2016-07-27 NOTE — Progress Notes (Signed)
[facility]  SUBJECTIVE: Patient is on Precedex and unable to give a history. Events of yesterday are noted. Patient developed acute on chronic worsening of delirium. Patient is currently hemodynamically stable. She remains afebrile.   Vitals:   07/27/16 0749 07/27/16 0800 07/27/16 0900 07/27/16 1000  BP:  (!) 130/96 132/85 (!) 143/89  Pulse: 66 67 62 64  Resp: Temp: 97.6 F (36.4 C)     TempSrc: Axillary     SpO2: 100% 100% 100% 100%  Weight:      Height:        Intake/Output Summary (Last 24 hours) at 07/27/16 1008 Last data filed at 07/27/16 1000  Gross per 24 hour  Intake           763.19 ml  Output              925 ml  Net          -161.81 ml    LABS: Basic Metabolic Panel:  Recent Labs  16/10/96 0037 07/27/16 0623  NA 136 139  K 3.2* 3.0*  CL 103 109  CO2 24 25  GLUCOSE 132* 131*  BUN 17 8  CREATININE 0.86 0.83  CALCIUM 8.8* 8.5*   Liver Function Tests:  Recent Labs  07/25/16 1820  AST 52*  ALT 28  ALKPHOS 109  BILITOT 0.5  PROT 8.2*  ALBUMIN 4.2    Recent Labs  07/25/16 1820  LIPASE 65*   CBC:  Recent Labs  07/25/16 1804 07/26/16 0037 07/27/16 0623  WBC 7.5 6.2 7.3  NEUTROABS 3.9  --   --   HGB 10.6* 10.1* 9.8*  HCT 32.3* 29.9* 29.6*  MCV 78.9* 77.8* 79.4*  PLT 325 300 271   Cardiac Enzymes:  Recent Labs  07/25/16 1804  TROPONINI 0.06*   BNP: Invalid input(s): POCBNP D-Dimer: No results for input(s): DDIMER in the last 72 hours. Hemoglobin A1C: No results for input(s): HGBA1C in the last 72 hours. Fasting Lipid Panel: No results for input(s): CHOL, HDL, LDLCALC, TRIG, CHOLHDL, LDLDIRECT in the last 72 hours. Thyroid Function Tests: No results for input(s): TSH, T4TOTAL, T3FREE, THYROIDAB in the last 72 hours.  Invalid input(s): FREET3 Anemia Panel: No results for input(s): VITAMINB12, FOLATE, FERRITIN, TIBC, IRON, RETICCTPCT in the last 72 hours.   Physical  Exam: Blood pressure (!) 143/89, pulse 64, temperature 97.6 F (36.4 C), temperature source Axillary, resp. rate 16, height  (1.626 m), weight 53 kg (116 lb 13.5 oz), last menstrual period 07/09/2016, SpO2 100 %.   Wt Readings from Last 1 Encounters:  07/26/16 53 kg (116 lb 13.5 oz)     General appearance: sedated  Resp: clear to auscultation bilaterally Cardio: regular rate and rhythm Neurologic: Mental status: alertness: sedated  TELEMETRY: Reviewed telemetry pt in nsr  ASSESSMENT AND PLAN:  Principal Problem:   Acute delirium-secondary to previous substance abuse as well as previous cerebral infarction. Appears to have subacute or chronic infarcts. We'll review transthoracic echo. Depending on results of this consideration for transesophageal echo could be raised however will need to determine a consistent method of anticoagulating. Patient has a mechanical mitral valve and will need chronic warfarin therapy. Given her mental status, and is unclear whether this will be able to be managed in an independent living situation. 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 Would  continue with IV Diflucan, Zosyn and vancomycin. Patient will likely need lifelong Diflucan. We'll also need to determine a method to ensure that this is consistently taken.  Altered mental state   Opiate abuse, episodic     Dalia HeadingFATH,Nataliee Shurtz A., MD, [facility] Hospital[facility] 07/27/2016 10:08 AM

## 2016-07-28 DIAGNOSIS — R930 Abnormal findings on diagnostic imaging of skull and head, not elsewhere classified: Secondary | ICD-10-CM

## 2016-07-28 LAB — BASIC METABOLIC PANEL
Anion gap: 4 — ABNORMAL LOW (ref 5–15)
BUN: 10 mg/dL (ref 6–20)
CALCIUM: 8.2 mg/dL — AB (ref 8.9–10.3)
CO2: 25 mmol/L (ref 22–32)
CREATININE: 0.87 mg/dL (ref 0.44–1.00)
Chloride: 109 mmol/L (ref 101–111)
GFR calc Af Amer: >60 mL/min (ref >60)
Glucose, Bld: 113 mg/dL — ABNORMAL HIGH (ref 65–99)
Potassium: 3 mmol/L — ABNORMAL LOW (ref 3.5–5.1)
SODIUM: 138 mmol/L (ref 135–145)

## 2016-07-28 LAB — ECHOCARDIOGRAM COMPLETE
Height: 64 in
Weight: 1869.5 oz

## 2016-07-28 LAB — CBC
HCT: 28.6 % — ABNORMAL LOW (ref 35.0–47.0)
Hemoglobin: 9.6 g/dL — ABNORMAL LOW (ref 12.0–16.0)
MCH: 26.4 pg (ref 26.0–34.0)
MCHC: 33.7 g/dL (ref 32.0–36.0)
MCV: 78.2 fL — ABNORMAL LOW (ref 80.0–100.0)
PLATELETS: 279 10*3/uL (ref 150–440)
RBC: 3.66 MIL/uL — ABNORMAL LOW (ref 3.80–5.20)
RDW: 18.6 % — ABNORMAL HIGH (ref 11.5–14.5)
WBC: 6.5 10*3/uL (ref 3.6–11.0)

## 2016-07-28 LAB — VANCOMYCIN, TROUGH: VANCOMYCIN TR: 18 ug/mL (ref 15–20)

## 2016-07-28 LAB — HEPARIN LEVEL (UNFRACTIONATED): HEPARIN UNFRACTIONATED: 0.53 [IU]/mL (ref 0.30–0.70)

## 2016-07-28 LAB — MAGNESIUM: Magnesium: 2 mg/dL (ref 1.7–2.4)

## 2016-07-28 MED ORDER — SODIUM CHLORIDE 0.9 % IV SOLN
INTRAVENOUS | Status: DC
  Administered 2016-07-28: 13:00:00 via INTRAVENOUS

## 2016-07-28 MED ORDER — WARFARIN - PHYSICIAN DOSING INPATIENT
Freq: Every day | Status: DC
  Administered 2016-07-29 – 2016-07-30 (×2)

## 2016-07-28 MED ORDER — POTASSIUM CHLORIDE CRYS ER 20 MEQ PO TBCR
40.0000 meq | EXTENDED_RELEASE_TABLET | ORAL | Status: AC
  Administered 2016-07-28 (×4): 40 meq via ORAL
  Filled 2016-07-28 (×4): qty 2
  Filled 2016-07-28: qty 1

## 2016-07-28 MED ORDER — WARFARIN SODIUM 5 MG PO TABS
7.5000 mg | ORAL_TABLET | Freq: Once | ORAL | Status: AC
  Administered 2016-07-28: 7.5 mg via ORAL
  Filled 2016-07-28: qty 2

## 2016-07-28 MED ORDER — OXYCODONE-ACETAMINOPHEN 5-325 MG PO TABS
1.0000 | ORAL_TABLET | Freq: Four times a day (QID) | ORAL | Status: DC | PRN
  Administered 2016-07-28 – 2016-08-01 (×10): 1 via ORAL
  Filled 2016-07-28 (×11): qty 1

## 2016-07-28 MED ORDER — POTASSIUM CHLORIDE 10 MEQ/100ML IV SOLN
10.0000 meq | INTRAVENOUS | Status: DC
  Filled 2016-07-28 (×4): qty 100

## 2016-07-28 NOTE — Consult Note (Signed)
[facility]   Reason for Consult:  Consult for this 45 year old woman who presented to the emergency room with confusion and agitation Referring Physician:  Posey Pronto Patient Identification: Rachel Morrison MRN:  [id number] Principal Diagnosis: Acute delirium Diagnosis:   Patient Active Problem List   Diagnosis Date Noted  . Acute delirium [R41.0] 07/26/2016  . 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 Acute cerebral infarction ([place]) [I63.9]   . Altered mental status [R41.82] 07/25/2016  . Brain lesion [G93.9] 07/25/2016  . Anxiety [F41.9] 07/25/2016  . H/O endocarditis [Z86.79] 07/25/2016  . Altered mental state [R41.82] 07/25/2016    Total Time spent with patient: 20 minutes  Subjective:    Follow-up for Monday the 14th. Patient reports she is feeling much better. Not having any hallucinations today. Not having any sense of confusion. She is alert and oriented 4 and much better informed about her medical condition. No mood symptoms. Esly Selvage is a 45 y.o. female patient admitted with "it's [place]!".  HPI:  Patient interviewed. Also spoke with her significant other who is present in the room. Chart reviewed. I reviewed notes through care everywhere as well and also reviewed her data from the controlled substance database. This 45 year old woman presented to the emergency room yesterday with confusion and agitation. According to her friend she became acutely agitated on Wednesday at which time she started to throw some confusion. This was more confused than normally but he settled her down and she got some sleep. All day Thursday and Friday however she has been much more confused and agitated. Yesterday apparently she tried to jump out of a moving car. Through a lot of belongings out of a car, seem to be frequently confused. He says that she has talked about going places and visiting people that do not make sense. This has been pretty consistent for the  last couple days. Patient's friend denies that she had been abusing any drugs in the time since her heart surgery. He denies that she has been on any a new medicine recently and says he doesn't think there is any way she could've been taking any pain medicine. He says that she was given a prescription for a modest dose of Xanax recently. That bottle is not present among her belongings and he says that she threw it out the window. He does not think that she had overdosed on it. He does not think that she could've been using any alcohol. He says that as far as he know she had been otherwise cooperative with medications. 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 She has been on anticoagulation since then. It is a little unclear to me from the history how consistently she has been able to keep up with all of her appointments. The friend reports that after the heart surgery she would occasionally have spells of acute confusion but that these would never last more than a few seconds at a time. Nothing lasted nearly as long as what she has right now. There is no indication that she had any actual suicidality or wish to die. She had not been having any other known psychiatric syndrome prior to this acute confusion. On evaluation today the patient was unable to give any history herself. She was extremely confused and labile. She required constant intervention from nursing or from her friend to keep her from climbing out of bed. Her speech was nonsensical and her mental status  waxed and waned as well as did her affect. Clearly disoriented.  Social history: Because of her acute agitation I was able to give only limited information. Apparently the patient and her friend had been living in the [place] area until fairly recently. I'm not sure if the reason they relocated up to this area was because of the heart surgery. In any case since being out of the hospital it  sounds like they're living in the [place] area probably with some family members.  Medical history: History of endocarditis. 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 Outside of that however no other known chronic mental health problems. She had had brain scans at [facility] around the time that she was having the heart valve replaced. I'm not sure that I have the expertise to make a comparison between those and her most recent CT just based on the verbal descriptions of them. Nevertheless it's clear that she's had strokes over time.  Substance abuse history: Patient was not able to give any information her and her friend was not particularly forthcoming. Information I got from the old chart however suggests that it was known that she had problems with intravenous opiate abuse in the past. How recently this had been going on is unclear. Boyfriend reports that he doesn't think she's had any drug or alcohol problems any time since her surgery. It looks like she was being prescribed Xanax up to 3 times a day in the past. He says she was on that for years from her primary care doctor in [place]. He says that she never abused it and usually took less than the 3 mg a day. She did get a prescription just a couple weeks ago at [facility] which is not currently present. Boyfriend denies ever having seen her delirious in a manner like this before in any context related to substance abuse.    Past Psychiatric History: She had apparently been prescribed 30 mg of Cymbalta at some point but according to him this was as part of a pain regimen. As far as he know she has not had any other psychiatric treatment. He is not aware of any history of suicidality or violence or psychotic disorder. Not aware of any psychiatric hospitalization. No evidence of any other psychoactive medicines except for the Cymbalta which looks like it has been empty for quite a while,  Xanax which is not present, and a brief prednisone taper which should've run out several weeks ago.  Risk to Self: Is patient at risk for suicide?: No Risk to Others:   Prior Inpatient Therapy:   Prior Outpatient Therapy:    Past Medical History:  Past Medical History:  Diagnosis Date  . Anxiety   . H/O endocarditis   . H/O mitral valve replacement   . HLD (hyperlipidemia)     Past Surgical History:  Procedure Laterality Date  . CARDIAC VALVE SURGERY     Family History:  Family History  Problem Relation Age of Onset  . Diabetes Mother   . Sleep apnea Mother   . Heart attack Father   . Hypertension Father    Family Psychiatric  History: Patient couldn't give any history. Boyfriend denies any knowledge of family history. Social History:  History  Alcohol Use No     History  Drug Use No    Social History   Social History  . Marital status: Married  Spouse name: N/A  . Number of children: N/A  . Years of education: N/A   Social History Main Topics  . Smoking status: Current Every Day Smoker    Packs/day: 1.50    Years: 30.00    Types: Cigarettes  . Smokeless tobacco: Never Used  . Alcohol use No  . Drug use: No  . Sexual activity: Not Asked   Other Topics Concern  . None   Social History Narrative  . None   Additional Social History:    Allergies:  No Known Allergies  Labs:  Results for orders placed or performed during the hospital encounter of 07/25/16 (from the past 48 hour(s))  Heparin level (unfractionated)     Status: None   Collection Time: 07/26/16  9:33 PM  Result Value Ref Range   Heparin Unfractionated 0.53 0.30 - 0.70 IU/mL    Comment:        IF HEPARIN RESULTS ARE BELOW EXPECTED VALUES, AND PATIENT DOSAGE HAS BEEN CONFIRMED, SUGGEST FOLLOW UP TESTING OF ANTITHROMBIN III LEVELS.   Glucose, capillary     Status: Abnormal   Collection Time: 07/26/16 10:22 PM  Result Value Ref Range   Glucose-Capillary 107 (H) 65 - 99 mg/dL  MRSA  PCR Screening     Status: None   Collection Time: 07/26/16 10:31 PM  Result Value Ref Range   MRSA by PCR NEGATIVE NEGATIVE    Comment:        The GeneXpert MRSA Assay (FDA approved for NASAL specimens only), is one component of a comprehensive MRSA colonization surveillance program. It is not intended to diagnose MRSA infection nor to guide or monitor treatment for MRSA infections.   CBC     Status: Abnormal   Collection Time: 07/27/16  6:23 AM  Result Value Ref Range   WBC 7.3 3.6 - 11.0 K/uL   RBC 3.73 (L) 3.80 - 5.20 MIL/uL   Hemoglobin 9.8 (L) 12.0 - 16.0 g/dL   HCT 29.6 (L) 35.0 - 47.0 %   MCV 79.4 (L) 80.0 - 100.0 fL   MCH 26.1 26.0 - 34.0 pg   MCHC 32.9 32.0 - 36.0 g/dL   RDW 18.3 (H) 11.5 - 14.5 %   Platelets 271 150 - 440 K/uL  Heparin level (unfractionated)     Status: Abnormal   Collection Time: 07/27/16  6:23 AM  Result Value Ref Range   Heparin Unfractionated 1.05 (H) 0.30 - 0.70 IU/mL    Comment:        IF HEPARIN RESULTS ARE BELOW EXPECTED VALUES, AND PATIENT DOSAGE HAS BEEN CONFIRMED, SUGGEST FOLLOW UP TESTING OF ANTITHROMBIN III LEVELS.   Basic metabolic panel     Status: Abnormal   Collection Time: 07/27/16  6:23 AM  Result Value Ref Range   Sodium 139 135 - 145 mmol/L   Potassium 3.0 (L) 3.5 - 5.1 mmol/L   Chloride 109 101 - 111 mmol/L   CO2 25 22 - 32 mmol/L   Glucose, Bld 131 (H) 65 - 99 mg/dL   BUN 8 6 - 20 mg/dL   Creatinine, Ser 0.83 0.44 - 1.00 mg/dL   Calcium 8.5 (L) 8.9 - 10.3 mg/dL   GFR calc non Af Amer >60 >60 mL/min   GFR calc Af Amer >60 >60 mL/min    Comment: (NOTE) The eGFR has been calculated using the CKD EPI equation. This calculation has not been validated in all clinical situations. eGFR's persistently <60 mL/min signify possible Chronic Kidney Disease.  Anion gap 5 5 - 15  Heparin level (unfractionated)     Status: Abnormal   Collection Time: 07/27/16  2:13 PM  Result Value Ref Range   Heparin Unfractionated 0.91  (H) 0.30 - 0.70 IU/mL    Comment:        IF HEPARIN RESULTS ARE BELOW EXPECTED VALUES, AND PATIENT DOSAGE HAS BEEN CONFIRMED, SUGGEST FOLLOW UP TESTING OF ANTITHROMBIN III LEVELS.   Heparin level (unfractionated)     Status: None   Collection Time: 07/27/16  9:11 PM  Result Value Ref Range   Heparin Unfractionated 0.62 0.30 - 0.70 IU/mL    Comment:        IF HEPARIN RESULTS ARE BELOW EXPECTED VALUES, AND PATIENT DOSAGE HAS BEEN CONFIRMED, SUGGEST FOLLOW UP TESTING OF ANTITHROMBIN III LEVELS.   Vancomycin, trough     Status: None   Collection Time: 07/28/16 12:42 AM  Result Value Ref Range   Vancomycin Tr 18 15 - 20 ug/mL  CBC     Status: Abnormal   Collection Time: 07/28/16  3:03 AM  Result Value Ref Range   WBC 6.5 3.6 - 11.0 K/uL   RBC 3.66 (L) 3.80 - 5.20 MIL/uL   Hemoglobin 9.6 (L) 12.0 - 16.0 g/dL   HCT 28.6 (L) 35.0 - 47.0 %   MCV 78.2 (L) 80.0 - 100.0 fL   MCH 26.4 26.0 - 34.0 pg   MCHC 33.7 32.0 - 36.0 g/dL   RDW 18.6 (H) 11.5 - 14.5 %   Platelets 279 150 - 440 K/uL  Basic metabolic panel     Status: Abnormal   Collection Time: 07/28/16  3:03 AM  Result Value Ref Range   Sodium 138 135 - 145 mmol/L   Potassium 3.0 (L) 3.5 - 5.1 mmol/L   Chloride 109 101 - 111 mmol/L   CO2 25 22 - 32 mmol/L   Glucose, Bld 113 (H) 65 - 99 mg/dL   BUN 10 6 - 20 mg/dL   Creatinine, Ser 0.87 0.44 - 1.00 mg/dL   Calcium 8.2 (L) 8.9 - 10.3 mg/dL   GFR calc non Af Amer >60 >60 mL/min   GFR calc Af Amer >60 >60 mL/min    Comment: (NOTE) The eGFR has been calculated using the CKD EPI equation. This calculation has not been validated in all clinical situations. eGFR's persistently <60 mL/min signify possible Chronic Kidney Disease.    Anion gap 4 (L) 5 - 15  Heparin level (unfractionated)     Status: None   Collection Time: 07/28/16  3:03 AM  Result Value Ref Range   Heparin Unfractionated 0.53 0.30 - 0.70 IU/mL    Comment:        IF HEPARIN RESULTS ARE BELOW EXPECTED VALUES,  AND PATIENT DOSAGE HAS BEEN CONFIRMED, SUGGEST FOLLOW UP TESTING OF ANTITHROMBIN III LEVELS.   Magnesium     Status: None   Collection Time: 07/28/16  3:03 AM  Result Value Ref Range   Magnesium 2.0 1.7 - 2.4 mg/dL    Current Facility-Administered Medications  Medication Dose Route Frequency Provider Last Rate Last Dose  . 0.9 %  sodium chloride infusion   Intravenous Continuous Teodoro Spray, MD 20 mL/hr at 07/28/16 1230    . acetaminophen (TYLENOL) tablet 650 mg  650 mg Oral Q6H PRN Lance Coon, MD   650 mg at 07/28/16 0865   Or  . acetaminophen (TYLENOL) suppository 650 mg  650 mg Rectal Q6H PRN Lance Coon, MD      .  DULoxetine (CYMBALTA) DR capsule 30 mg  30 mg Oral Daily Lance Coon, MD   30 mg at 07/28/16 0929  . fluconazole (DIFLUCAN) IVPB 400 mg  400 mg Intravenous Q24H Orbie Pyo, MD   400 mg at 07/28/16 0930  . folic acid (FOLVITE) tablet 1 mg  1 mg Oral Daily Lance Coon, MD   1 mg at 07/28/16 0929  . haloperidol lactate (HALDOL) injection 5 mg  5 mg Intravenous Q4H PRN Gonzella Lex, MD   5 mg at 07/26/16 1752  . heparin ADULT infusion 100 units/mL (25000 units/239m sodium chloride 0.45%)  850 Units/hr Intravenous Continuous SDustin Flock MD 8.5 mL/hr at 07/28/16 1000 850 Units/hr at 07/28/16 1000  . LORazepam (ATIVAN) injection 2 mg  2 mg Intravenous Q4H PRN JGonzella Lex MD   2 mg at 07/27/16 2336  . morphine 2 MG/ML injection 2 mg  2 mg Intravenous Q4H PRN DLance Coon MD   2 mg at 07/28/16 0929  . multivitamin with minerals tablet 1 tablet  1 tablet Oral Daily DLance Coon MD   1 tablet at 07/28/16 0929  . nicotine (NICODERM CQ - dosed in mg/24 hours) patch 21 mg  21 mg Transdermal Daily SDustin Flock MD   21 mg at 07/28/16 0929  . ondansetron (ZOFRAN) tablet 4 mg  4 mg Oral Q6H PRN DLance Coon MD       Or  . ondansetron ([facility] injection 4 mg  4 mg Intravenous Q6H PRN DLance Coon MD      . oxyCODONE-acetaminophen (PERCOCET/ROXICET) 5-325  MG per tablet 1 tablet  1 tablet Oral Q6H PRN SDustin Flock MD   1 tablet at 07/28/16 1804  . potassium chloride SA (K-DUR,KLOR-CON) CR tablet 40 mEq  40 mEq Oral Q4H SDustin Flock MD   40 mEq at 07/28/16 1804  . sodium chloride flush (NS) 0.9 % injection 3 mL  3 mL Intravenous Q12H DLance Coon MD   3 mL at 07/28/16 0931  . sodium chloride flush (NS) 0.9 % injection 3 mL  3 mL Intravenous PRN SDustin Flock MD   3 mL at 07/26/16 1754  . sodium chloride flush (NS) 0.9 % injection 3 mL  3 mL Intravenous Q12H SDustin Flock MD   3 mL at 07/28/16 0931  . thiamine (VITAMIN B-1) tablet 100 mg  100 mg Oral Daily DLance Coon MD   100 mg at 07/28/16 00981  Or  . thiamine (B-1) injection 100 mg  100 mg Intravenous Daily DLance Coon MD   100 mg at 07/27/16 0916  . Warfarin - Physician Dosing Inpatient   Does not apply [id number] Flock MD        Musculoskeletal: Strength & Muscle Tone: decreased Gait & Station: unsteady, ataxic, unable to stand Patient leans: Backward  Psychiatric Specialty Exam: Physical Exam  Nursing note and vitals reviewed. Constitutional: She appears well-developed and well-nourished. She appears cachectic. She is uncooperative. No distress.  HENT:  Head: Normocephalic and atraumatic.  Eyes: Conjunctivae are normal. Pupils are equal, round, and reactive to light.  Neck: Normal range of motion.  Cardiovascular: Normal rate and normal heart sounds.   Respiratory: Effort normal. No respiratory distress.  GI: Soft.  Musculoskeletal: Normal range of motion.  Neurological: She is alert.  Skin: Skin is warm and dry.  Psychiatric: Her affect is not labile and not inappropriate. Her speech is not tangential. She is not agitated. Cognition and memory are normal. Cognition and memory are not  impaired. She does not express impulsivity. She is communicative. She is attentive.    Review of Systems  Unable to perform ROS: Mental status change    Blood pressure (!)  144/87, pulse 85, temperature 98.2 F (36.8 C), temperature source Oral, resp. rate 20, height 5' 4"  (1.626 m), weight 53 kg (116 lb 13.5 oz), last menstrual period 07/09/2016, SpO2 100 %.Body mass index is 20.06 kg/m.  General Appearance: Disheveled  Eye Contact:  Minimal  Speech:  Garbled and Slurred  Volume:  Decreased  Mood:  Mood is not really stated and varies from moment to moment in the appearance.  Affect:  Labile  Thought Process:  Disorganized and Irrelevant  Orientation:  Negative  Thought Content:  Illogical  Suicidal Thoughts:  Unknown but does not appear to be trying to hurt himself intentionally  Homicidal Thoughts:  Unknown but there is no evidence that she's trying to hurt anyone else intentionally  Memory:  Immediate;   Poor Recent;   Poor Remote;   Poor  Judgement:  Poor  Insight:  Lacking  Psychomotor Activity:  Increased and Restlessness  Concentration:  Concentration: Poor  Recall:  Poor  Fund of Knowledge:  Poor  Language:  Poor  Akathisia:  No  Handed:  Right  AIMS (if indicated):     Assets:  Social Support  ADL's:  Impaired  Cognition:  Impaired,  Moderate and Severe  Sleep:        Treatment Plan Summary: Daily contact with patient to assess and evaluate symptoms and progress in treatment, Medication management and Plan Whatever the underlying cause of her severe delirium it appears to have resolved. Reviewed all of the recent history with the patient. No additional medication. I will follow-up as needed.  Disposition: Patient does not meet criteria for psychiatric inpatient admission.  Alethia Berthold, MD 07/28/2016 7:19 PM

## 2016-07-28 NOTE — Progress Notes (Signed)
Pharmacy Antibiotic Note  Rachel GheeJanice Hyle is a 45 y.o. 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  Pharmacy has been consulted for vancomycin, Zosyn, and fluconazole dosing.  Plan: After discussion with Dr. Allena KatzPatel, will continue vancomycin and Zosyn for now and f/u ID recs.   Continue vancomycin 750 mg q 12 hours.  Continue Zosyn 4.5 g EI q 8 hours.  Fluconazole 800 mg x1  ordered with 400 mg daily as continuation.   Height: 5\' 4"  (162.6 cm) Weight: 116 lb 13.5 oz (53 kg) IBW/kg (Calculated) : 54.7  Temp (24hrs), Avg:98.2 F (36.8 C), Min:97.5 F (36.4 C), Max:98.9 F (37.2 C)   Recent Labs Lab 07/25/16 1804 07/25/16 1820 07/25/16 2200 07/26/16 0037 07/27/16 0623 07/28/16 0042 07/28/16 0303  WBC 7.5  --   --  6.2 7.3  --  6.5  CREATININE  --  0.84  --  0.86 0.83  --  0.87  LATICACIDVEN  --  2.0* 1.9  --   --   --   --   VANCOTROUGH  --   --   --   --   --  18  --     Estimated Creatinine Clearance: 67.6 mL/min (by C-G formula based on SCr of 0.87 mg/dL).    No Known Allergies  Antimicrobials this admission: vancomycin  8/12 >>  Zosyn 8/12 >>  Fluconazole 8/12 >>  Dose adjustments this admission:  8/14 00:30 vanc level 18. Continue current regimen   Microbiology results: 8/12 MRSA PCR: negative 8/11 BCx: NGTD 8/11 UCx: mx species   8/11 CXR: no active disease 8/11 UA: (-)  Thank you for allowing pharmacy to be a part of this patient's care.  Luisa HartChristy, Mariacristina Aday D 07/28/2016 11:30 AM

## 2016-07-28 NOTE — Care Management Note (Signed)
Case Management Note  Patient Details  Name: Rachel Morrison MRN: [id number] Date of Birth: 1971-03-06  Subjective/Objective:                  Spoke with patient's spouse Rachel Morrison [phone] who states that she is Medicaid pending since about 3 weeks ago. He states she has been confused since cardiac surgery intermittently (May 2017). She was independent with mobility prior to this admission. [place] physicians were in [place] for more family support. She is established with [facility] heart doctor and has an appointment pending with a PCP here in [place] in about a month but he cannot remember name.  Action/Plan:    RNCM will continue to follow.   Expected Discharge Date:                  Expected Discharge Plan:     In-House Referral:     Discharge planning Services     Post Acute Care Choice:    Choice offered to:     DME Arranged:    DME Agency:     HH Arranged:    HH Agency:     Status of Service:     If discussed at [employer] Meetings, dates discussed:    Additional Comments:  Collie Siadngela Paislie Tessler, RN 07/28/2016, 11:42 AM

## 2016-07-28 NOTE — Progress Notes (Signed)
Physical Therapy Treatment Patient Details Name: Rachel Morrison MRN: [id number] DOB: Dec 11, 1971 Today's Date: 07/28/2016    History of Present Illness 45 y.o. female who presents with Altered mental status and hallucinations: people, formations, etc. Patient states that she has had some other hallucinations recently. Sometimes she can tolerate hallucinations and other times not. 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 She's had difficult time maintaining therapeutic INR with several episodes of subtherapeutic INR. She denies any recent infectious symptoms. 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    PT Comments    Pt reports no pain today and in a pleasant mood today, no agitation or hallucinations. Pt is modified independent with bed mobility and transfers, min guard for ambulation with single UE support on IV pole (~220 ft). Attempted gait with no AD, pt unsteady and staggering to L and R. Pt reports her balance has been "off" since hospital admission. Steadiness with gait significantly improved with single UE support on IV pole with gait. Step-through gait pattern with slight staggering to L, no LOB noted. Mod-DGI score of 10/12 with ambulation with IV pole. PT mentions bringing cane in for next session to see if it improves stability. Difficulty with standing balance exercises, LOB when bringing feet together, mod assist from PT to regain balance. Pt also demonstrates increased sway in all directions with feet together eyes open/closed, min assist from PT to maintain balance. Pt will benefit from skilled PT services in order to improve balance, functional mobility, and safety with DME use. At this time pt is appropriate  for outpatient PT to address deficits.   Follow Up Recommendations  Outpatient PT     Equipment Recommendations  Cane    Recommendations for Other Services       Precautions / Restrictions Precautions Precautions: Fall Restrictions Weight Bearing Restrictions: No    Mobility  Bed Mobility Overal bed mobility: Modified Independent             General bed mobility comments: supervision and assistance required for lines and foley  Transfers Overall transfer level: Modified independent Equipment used: None             General transfer comment: Pt able to sit/stand with no difficulty, no LOB noted.  Ambulation/Gait Ambulation/Gait assistance: Min guard Ambulation Distance (Feet): 220 Feet Assistive device:  (Iv pole) Gait Pattern/deviations: WFL(Within Functional Limits);Staggering left     General Gait Details: Pt demonstrates a step-through gait pattern with slight staggering to the L at times. Pt requires assistance of IV pole with ambulation.    Stairs            Wheelchair Mobility    Modified Rankin (Stroke Patients Only)       Balance Overall balance assessment: Needs assistance Sitting-balance support: No upper extremity supported;Feet supported Sitting balance-Leahy Scale: Normal     Standing balance support: No upper extremity supported Standing balance-Leahy Scale: Good Standing balance comment: Pt demonstrates increased sway with standing with no UE support. Standing balance improves with UE support on IV pole/counter                    Cognition Arousal/Alertness: Awake/alert Behavior During Therapy: WFL for tasks assessed/performed Overall Cognitive Status: Within Functional Limits  for tasks assessed                      Exercises Other Exercises Other Exercises: Standing balance exercises: B LE single leg stance 20 secs x 3 trials, heels raises and hip abd/add x 10 reps, feet together eyes open 10 secs x 3  trials, and feet together eyes closed 10 secs x 3 trials. All standing balance performed with counter for UE support if needed and with verbal cueing and with min guard.    General Comments        Pertinent Vitals/Pain Pain Assessment: No/denies pain    Home Living                      Prior Function            PT Goals (current goals can now be found in the care plan section) Acute Rehab PT Goals Patient Stated Goal: To return home PT Goal Formulation: With patient Time For Goal Achievement: 08/09/16 Potential to Achieve Goals: Good Progress towards PT goals: Progressing toward goals    Frequency  Min 2X/week    PT Plan Current plan remains appropriate    Co-evaluation             End of Session Equipment Utilized During Treatment: Gait belt Activity Tolerance: Patient tolerated treatment well Patient left: in bed;with call bell/phone within reach;with bed alarm set;with family/visitor present     Time: [id number] PT Time Calculation (min) (ACUTE ONLY): 19 min  Charges:                       G Codes:      Thereasa ParkinShagun Kimaya Whitlatch 07/28/2016, 5:17 PM  Thereasa ParkinShagun Zekiah Coen, SPT [phone]

## 2016-07-28 NOTE — Progress Notes (Signed)
ANTICOAGULATION CONSULT NOTE - Initial Consult  Pharmacy Consult for heparin drip Indication: mechanical mitral valve  No Known Allergies  Patient Measurements: Height: 5\' 4"  (162.6 cm) Weight: 116 lb 13.5 oz (53 kg) IBW/kg (Calculated) : 54.7 Heparin Dosing Weight: 52 kg  Vital Signs: Temp: 98.9 F (37.2 C) (08/13 2000) Temp Source: Oral (08/13 2000) BP: 149/78 (08/14 0200) Pulse Rate: 74 (08/14 0200)  Labs:  Recent Labs  07/25/16 1804  07/26/16 0037  07/27/16 0623 07/27/16 1413 07/27/16 2111 07/28/16 0303  HGB 10.6*  --  10.1*  --  9.8*  --   --  9.6*  HCT 32.3*  --  29.9*  --  29.6*  --   --  28.6*  PLT 325  --  300  --  271  --   --  279  APTT  --   --  36  --   --   --   --   --   LABPROT 21.3*  --   --   --   --   --   --   --   INR 1.82  --   --   --   --   --   --   --   HEPARINUNFRC  --   --   --   < > 1.05* 0.91* 0.62 0.53  CREATININE  --   < > 0.86  --  0.83  --   --  0.87  TROPONINI 0.06*  --   --   --   --   --   --   --   < > = values in this interval not displayed.  Estimated Creatinine Clearance: 67.6 mL/min (by C-G formula based on SCr of 0.87 mg/dL).   Medical History: Past Medical History:  Diagnosis Date  . Anxiety   . H/O endocarditis   . H/O mitral valve replacement   . HLD (hyperlipidemia)     Medications:  On warfarin as outpatient.   Assessment: INR subtherapeutic on admission.   Goal of Therapy:  Heparin level 0.3-0.7 units/ml Monitor platelets by anticoagulation protocol: Yes   Plan:  Heparin level= 0.62. Will continue heparin infusion at 850 units/hr and order confirmatory  HL in 6 hours.   8/14 03:00 heparin level 0.53. Continue current regimen. Recheck heparin level and CBC with tomorrow AM labs.  Annick Dimaio Kathie RhodesS, PharmD 07/28/2016,4:42 AM

## 2016-07-28 NOTE — Consult Note (Signed)
[facility] Infectious Disease     Reason for Consult: Hx endocarditis, AMS    Referring Physician: Dustin Flock Date of Admission:  07/25/2016   Principal Problem:   Acute delirium Active Problems:   Altered mental status   Brain lesion   Anxiety   H/O endocarditis   Altered mental state   Opiate abuse, episodic   Acute cerebral infarction [facility])   HPI: Rachel Morrison is a 45 y.o. 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 She is currently back to her nml ment status but per her boyfriend she was agitated and tried to jump out of a car last week.  A few days prior she had nv x 2 days and missed some meds due to this.  She had not had any fevers, chills sweats.  Denies relapse of IVDU. No new meds - except occas benadryl   Past Medical History:  Diagnosis Date  . Anxiety   . H/O endocarditis   . H/O mitral valve replacement   . HLD (hyperlipidemia)    Past Surgical History:  Procedure Laterality Date  . CARDIAC VALVE SURGERY     Social History  Substance Use Topics  . Smoking status: Current Every Day Smoker    Packs/day: 1.50    Years: 30.00    Types: Cigarettes  . Smokeless tobacco: Never Used  . Alcohol use No   Family History  Problem Relation Age of Onset  . Diabetes Mother   . Sleep apnea Mother   . Heart attack Father   . Hypertension Father     Allergies: No Known Allergies  Current antibiotics: Antibiotics Given (last 72 hours)    Date/Time Action Medication Dose Rate   07/26/16 0200 Given   vancomycin (VANCOCIN) IVPB 1000 mg/200 mL premix 1,000 mg 200 mL/hr   07/26/16 0307 Given   piperacillin-tazobactam (ZOSYN) IVPB 4.5 g 4.5 g 25 mL/hr   07/26/16 0937 Given   piperacillin-tazobactam (ZOSYN) IVPB 4.5 g 4.5 g 25 mL/hr   07/26/16 1304 Given   vancomycin (VANCOCIN) IVPB 750 mg/150 ml premix 750 mg 150 mL/hr   07/26/16 1849 Given   piperacillin-tazobactam (ZOSYN) IVPB 4.5 g 4.5 g 25 mL/hr   07/27/16 0237 Given   vancomycin (VANCOCIN) IVPB 750 mg/150 ml premix 750 mg 150 mL/hr   07/27/16 0422 Given   piperacillin-tazobactam (ZOSYN) IVPB 4.5 g 4.5 g 25 mL/hr   07/27/16 0915 Given   piperacillin-tazobactam (ZOSYN) IVPB 4.5 g 4.5 g 25 mL/hr   07/27/16 1326 Given   vancomycin (VANCOCIN) IVPB 750 mg/150 ml premix 750 mg 150 mL/hr   07/27/16 1711 Given   piperacillin-tazobactam (ZOSYN) IVPB 4.5 g 4.5 g 25 mL/hr   07/28/16 0121 Given   vancomycin (VANCOCIN) IVPB 750 mg/150 ml premix 750 mg 150 mL/hr   07/28/16 0228 Given   piperacillin-tazobactam (ZOSYN) IVPB 4.5 g 4.5 g 25 mL/hr   07/28/16 0930 Given   piperacillin-tazobactam (ZOSYN) IVPB 4.5 g 4.5 g 25 mL/hr   07/28/16 1339 Given   vancomycin (VANCOCIN) IVPB 750 mg/150 ml premix 750 mg 150 mL/hr      MEDICATIONS: . DULoxetine  30 mg Oral Daily  . fluconazole (DIFLUCAN) IV  400 mg Intravenous Q24H  . folic acid  1 mg Oral Daily  . multivitamin with minerals  1 tablet Oral Daily  . nicotine  21 mg Transdermal Daily  . piperacillin-tazobactam (ZOSYN)  IV  4.5 g Intravenous Q8H  . potassium chloride  40 mEq Oral Q4H  . sodium chloride flush  3 mL Intravenous Q12H  . sodium chloride flush  3 mL Intravenous Q12H  . thiamine  100 mg Oral Daily   Or  . thiamine  100 mg Intravenous Daily  . vancomycin  750 mg Intravenous Q12H  . warfarin  7.5 mg Oral ONCE-1800  . Warfarin - Physician Dosing Inpatient   Does not apply q1800    Review of Systems - 11 systems reviewed and negative per HPI   OBJECTIVE: Temp:  [98.2 F (36.8 C)-98.9 F (37.2 C)] 98.2 F (36.8 C) (08/14 1147) Pulse Rate:  [61-93] 85 (08/14 1147) Resp:  [15-29] 20 (08/14 1147) BP: (123-154)/(77-104) 144/87 (08/14 1147) SpO2:  [99 %-100 %] 100 % (08/14 1147) Physical Exam  Constitutional:  oriented to person, place, and time. appears well-developed and well-nourished. No distress.  HENT: [place]/AT,  PERRLA, no scleral icterus Mouth/Throat: Oropharynx is clear and moist. No oropharyngeal exudate.  Cardiovascular: Normal rate, regular rhythm and normal heart sounds. Mech HS  Pulmonary/Chest: Effort normal and breath sounds normal. No respiratory distress.   Neck = supple, no nuchal rigidity Abdominal: Soft. Bowel sounds are normal.  exhibits no distension. There is no tenderness.  Lymphadenopathy: no cervical adenopathy. No axillary adenopathy Neurological: alert and oriented to person, place, and time.  Skin: multipel scabs on UE.  Psychiatric: a normal mood and affect.  behavior is normal.    LABS: Results for orders placed or performed during the hospital encounter of 07/25/16 (from the past 48 hour(s))  Heparin level (unfractionated)     Status: None   Collection Time: 07/26/16  9:33 PM  Result Value Ref Range   Heparin Unfractionated 0.53 0.30 - 0.70 IU/mL    Comment:        IF HEPARIN RESULTS ARE BELOW EXPECTED VALUES, AND PATIENT DOSAGE HAS BEEN CONFIRMED, SUGGEST FOLLOW UP TESTING OF ANTITHROMBIN III LEVELS.   Glucose, capillary     Status: Abnormal   Collection Time: 07/26/16 10:22 PM  Result Value Ref Range   Glucose-Capillary 107 (H) 65 - 99 mg/dL  MRSA PCR Screening     Status: None   Collection Time: 07/26/16 10:31 PM  Result Value Ref Range   MRSA by PCR NEGATIVE NEGATIVE    Comment:        The GeneXpert MRSA Assay (FDA approved for NASAL specimens only), is one component of a comprehensive MRSA colonization surveillance program. It is not intended to diagnose MRSA infection nor to guide or monitor treatment for MRSA infections.   CBC     Status: Abnormal   Collection Time: 07/27/16  6:23 AM  Result Value Ref Range   WBC 7.3 3.6 - 11.0 K/uL   RBC 3.73 (L) 3.80 - 5.20 MIL/uL   Hemoglobin 9.8 (L) 12.0 - 16.0 g/dL   HCT 29.6 (L) 35.0 - 47.0 %   MCV 79.4 (L) 80.0 - 100.0 fL   MCH 26.1 26.0 - 34.0 pg   MCHC 32.9 32.0 - 36.0 g/dL   RDW 18.3 (H) 11.5 -  14.5 %   Platelets 271 150 - 440 K/uL  Heparin level (unfractionated)     Status: Abnormal   Collection Time: 07/27/16  6:23 AM  Result Value Ref Range   Heparin Unfractionated 1.05 (H) 0.30 - 0.70 IU/mL    Comment:        IF HEPARIN RESULTS ARE BELOW EXPECTED VALUES, AND PATIENT DOSAGE  HAS BEEN CONFIRMED, SUGGEST FOLLOW UP TESTING OF ANTITHROMBIN III LEVELS.   Basic metabolic panel     Status: Abnormal   Collection Time: 07/27/16  6:23 AM  Result Value Ref Range   Sodium 139 135 - 145 mmol/L   Potassium 3.0 (L) 3.5 - 5.1 mmol/L   Chloride 109 101 - 111 mmol/L   CO2 25 22 - 32 mmol/L   Glucose, Bld 131 (H) 65 - 99 mg/dL   BUN 8 6 - 20 mg/dL   Creatinine, Ser 0.83 0.44 - 1.00 mg/dL   Calcium 8.5 (L) 8.9 - 10.3 mg/dL   GFR calc non Af Amer >60 >60 mL/min   GFR calc Af Amer >60 >60 mL/min    Comment: (NOTE) The eGFR has been calculated using the CKD EPI equation. This calculation has not been validated in all clinical situations. eGFR's persistently <60 mL/min signify possible Chronic Kidney Disease.    Anion gap 5 5 - 15  Heparin level (unfractionated)     Status: Abnormal   Collection Time: 07/27/16  2:13 PM  Result Value Ref Range   Heparin Unfractionated 0.91 (H) 0.30 - 0.70 IU/mL    Comment:        IF HEPARIN RESULTS ARE BELOW EXPECTED VALUES, AND PATIENT DOSAGE HAS BEEN CONFIRMED, SUGGEST FOLLOW UP TESTING OF ANTITHROMBIN III LEVELS.   Heparin level (unfractionated)     Status: None   Collection Time: 07/27/16  9:11 PM  Result Value Ref Range   Heparin Unfractionated 0.62 0.30 - 0.70 IU/mL    Comment:        IF HEPARIN RESULTS ARE BELOW EXPECTED VALUES, AND PATIENT DOSAGE HAS BEEN CONFIRMED, SUGGEST FOLLOW UP TESTING OF ANTITHROMBIN III LEVELS.   Vancomycin, trough     Status: None   Collection Time: 07/28/16 12:42 AM  Result Value Ref Range   Vancomycin Tr 18 15 - 20 ug/mL  CBC     Status: Abnormal   Collection Time: 07/28/16  3:03 AM  Result Value Ref  Range   WBC 6.5 3.6 - 11.0 K/uL   RBC 3.66 (L) 3.80 - 5.20 MIL/uL   Hemoglobin 9.6 (L) 12.0 - 16.0 g/dL   HCT 28.6 (L) 35.0 - 47.0 %   MCV 78.2 (L) 80.0 - 100.0 fL   MCH 26.4 26.0 - 34.0 pg   MCHC 33.7 32.0 - 36.0 g/dL   RDW 18.6 (H) 11.5 - 14.5 %   Platelets 279 150 - 440 K/uL  Basic metabolic panel     Status: Abnormal   Collection Time: 07/28/16  3:03 AM  Result Value Ref Range   Sodium 138 135 - 145 mmol/L   Potassium 3.0 (L) 3.5 - 5.1 mmol/L   Chloride 109 101 - 111 mmol/L   CO2 25 22 - 32 mmol/L   Glucose, Bld 113 (H) 65 - 99 mg/dL   BUN 10 6 - 20 mg/dL   Creatinine, Ser 0.87 0.44 - 1.00 mg/dL   Calcium 8.2 (L) 8.9 - 10.3 mg/dL   GFR calc non Af Amer >60 >60 mL/min   GFR calc Af Amer >60 >60 mL/min    Comment: (NOTE) The eGFR has been calculated using the CKD EPI equation. This calculation has not been validated in all clinical situations. eGFR's persistently <60 mL/min signify possible Chronic Kidney Disease.    Anion gap 4 (L) 5 - 15  Heparin level (unfractionated)     Status: None   Collection Time: 07/28/16  3:03 AM  Result Value Ref Range   Heparin Unfractionated 0.53 0.30 - 0.70 IU/mL    Comment:        IF HEPARIN RESULTS ARE BELOW EXPECTED VALUES, AND PATIENT DOSAGE HAS BEEN CONFIRMED, SUGGEST FOLLOW UP TESTING OF ANTITHROMBIN III LEVELS.   Magnesium     Status: None   Collection Time: 07/28/16  3:03 AM  Result Value Ref Range   Magnesium 2.0 1.7 - 2.4 mg/dL   No components found for: ESR, C REACTIVE PROTEIN MICRO: Recent Results (from the past 720 hour(s))  Blood culture (routine x 2)     Status: None (Preliminary result)   Collection Time: 07/25/16  7:01 PM  Result Value Ref Range Status   Specimen Description BLOOD LEFT ARM  Final   Special Requests BOTTLES DRAWN AEROBIC AND ANAEROBIC [place]  Final   Culture NO GROWTH 3 DAYS  Final   Report Status PENDING  Incomplete  Blood culture (routine x 2)     Status: None (Preliminary result)    Collection Time: 07/25/16  7:01 PM  Result Value Ref Range Status   Specimen Description BLOOD LEFT ASSIST CONTROL  Final   Special Requests BOTTLES DRAWN AEROBIC AND ANAEROBIC [place]  Final   Culture NO GROWTH 3 DAYS  Final   Report Status PENDING  Incomplete  Urine culture     Status: Abnormal   Collection Time: 07/25/16  7:10 PM  Result Value Ref Range Status   Specimen Description URINE, RANDOM  Final   Special Requests NONE  Final   Culture MULTIPLE SPECIES PRESENT, SUGGEST RECOLLECTION (A)  Final   Report Status 07/27/2016 FINAL  Final  Blood culture (single)     Status: None (Preliminary result)   Collection Time: 07/25/16  8:34 PM  Result Value Ref Range Status   Specimen Description BLOOD LEFT FOREARM  Final   Special Requests BOTTLES DRAWN AEROBIC AND ANAEROBIC [id number]  Final   Culture NO GROWTH 3 DAYS  Final   Report Status PENDING  Incomplete  MRSA PCR Screening     Status: None   Collection Time: 07/26/16 10:31 PM  Result Value Ref Range Status   MRSA by PCR NEGATIVE NEGATIVE Final    Comment:        The GeneXpert MRSA Assay (FDA approved for NASAL specimens only), is one component of a comprehensive MRSA colonization surveillance program. It is not intended to diagnose MRSA infection nor to guide or monitor treatment for MRSA infections.     IMAGING: Dg Chest 1 View  Result Date: 07/25/2016 CLINICAL DATA:  Altered mental status. Recent mitral valve replacement. EXAM: CHEST 1 VIEW COMPARISON:  None. FINDINGS: Sternotomy wires appear aligned and intact. Mitral valve prosthesis is in place. Normal heart size. Normal mediastinal contour. No pneumothorax. No pleural effusion. Lungs appear clear, with no acute consolidative airspace disease and no pulmonary edema. IMPRESSION: No active disease. Electronically Signed   By: Ilona Sorrel M.D.   On: 07/25/2016 18:40   Ct Head Wo Contrast  Result Date: 07/25/2016 CLINICAL DATA:  Altered mental status.  Anticoagulated on Coumadin for recent mitral valve replacement. Confusion and hallucinations. EXAM: CT HEAD WITHOUT CONTRAST TECHNIQUE: Contiguous axial images were obtained from the base of the skull through the vertex without intravenous contrast. COMPARISON:  None. FINDINGS: There is a large focus of encephalomalacia in the medial right occipital lobe with associated ex vacuo dilatation of the occipital horn of the right lateral ventricle. There is a small focus of encephalomalacia in the  left superior cerebellar hemisphere. There is a hypoechoic focus in the right thalamus. No evidence of parenchymal hemorrhage or extra-axial fluid collection. No mass lesion, mass effect, or midline shift. Otherwise no ventriculomegaly. The visualized paranasal sinuses are essentially clear. The mastoid air cells are unopacified. No evidence of calvarial fracture. IMPRESSION: 1. Large focus of encephalomalacia in the medial right occipital lobe with ex vacuo dilatation of the occipital horn of the right lateral ventricle, most suggestive of a chronic infarct. 2. Small focus of encephalomalacia in the left superior cerebellar hemisphere, most suggestive of a late subacute to chronic infarct. 3. Hypoechoic focus in the right thalamus, suggesting an infarct of uncertain chronicity, probably subacute to chronic. Correlate with brain MRI as clinically warranted. 4. No acute intracranial hemorrhage. No significant mass effect or midline shift. Electronically Signed   By: Ilona Sorrel M.D.   On: 07/25/2016 18:50    Assessment:   Maysun Meditz is a 45 y.o. 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 She has no fevers, no leukocytosis. TTE is neg for veg, TEE pending.  She has encephalomalacia on admission CT, and chronic changes although CT and MRI at [facility] did not  show CVA or embolic phenomenon.   She reports being compliant with coumadin and fluconazole except when she was vomiting last week for 2 days. [place] neg on admit  Recommendations Continue fluconazole - can change to po once stable Dc vanco and zosyn as bcx negative Will follow  Thank you very much for allowing me to participate in the care of this patient. Please call with questions.   Cheral Marker. Ola Spurr, MD

## 2016-07-28 NOTE — Progress Notes (Signed)
Notified Dr Dema SeverinMungal of elevated MAP, acknowledged pt is under cardiologist care, no orders at this this time. this time

## 2016-07-28 NOTE — Progress Notes (Signed)
Subjective: Patient awake and alert.  More oriented.    Objective: Current vital signs: BP (!) 144/100 (BP Location: Left Arm)   Pulse 84   Temp 98.9 F (37.2 C) (Axillary)   Resp 20   Ht 5\' 4"  (1.626 m)   Wt 53 kg (116 lb 13.5 oz)   LMP 07/09/2016 (Approximate)   SpO2 100%   BMI 20.06 kg/m  Vital signs in last 24 hours: Temp:  [97.5 F (36.4 C)-98.9 F (37.2 C)] 98.9 F (37.2 C) (08/14 0729) Pulse Rate:  [59-93] 84 (08/14 1000) Resp:  [15-29] 20 (08/14 1000) BP: (122-154)/(77-109) 144/100 (08/14 1000) SpO2:  [99 %-100 %] 100 % (08/14 1000)  Intake/Output from previous day: 08/13 0701 - 08/14 0700 In: 1657.8 [P.O.:780; I.V.:267.8; IV Piggyback:600] Out: 2725 [Urine:2725] Intake/Output this shift: Total I/O In: 775.5 [P.O.:350; I.V.:25.5; IV Piggyback:400] Out: -  Nutritional status: Diet regular Room service appropriate? Yes; Fluid consistency: Thin  Neurologic Exam: Mental Status: Alert, oriented, thought content appropriate.  Speech fluent without evidence of aphasia.  Able to follow 3 step commands without difficulty. Cranial Nerves: II: Discs flat bilaterally; Visual fields grossly normal, pupils equal, round, reactive to light and accommodation III,IV, VI: ptosis not present, extra-ocular motions intact bilaterally V,VII: smile symmetric, facial light touch sensation normal bilaterally VIII: hearing normal bilaterally IX,X: gag reflex present XI: bilateral shoulder shrug XII: midline tongue extension Motor: 5/5 throughout Sensory: Pinprick and light touch intact throughout, bilaterally Cerebellar: Normal finger-to-nose and normal heel-to-shin testing bilaterally   Lab Results: Basic Metabolic Panel:  Recent Labs Lab 07/25/16 1820 07/26/16 0037 07/27/16 0623 07/28/16 0303  NA 136 136 139 138  K 3.8 3.2* 3.0* 3.0*  CL 103 103 109 109  CO2 23 24 25 25   GLUCOSE 92 132* 131* 113*  BUN 19 17 8 10   CREATININE 0.84 0.86 0.83 0.87  CALCIUM 9.6 8.8*  8.5* 8.2*    Liver Function Tests:  Recent Labs Lab 07/25/16 1820  AST 52*  ALT 28  ALKPHOS 109  BILITOT 0.5  PROT 8.2*  ALBUMIN 4.2    Recent Labs Lab 07/25/16 1820  LIPASE 65*    Recent Labs Lab 07/25/16 1820  AMMONIA 13    CBC:  Recent Labs Lab 07/25/16 1804 07/26/16 0037 07/27/16 0623 07/28/16 0303  WBC 7.5 6.2 7.3 6.5  NEUTROABS 3.9  --   --   --   HGB 10.6* 10.1* 9.8* 9.6*  HCT 32.3* 29.9* 29.6* 28.6*  MCV 78.9* 77.8* 79.4* 78.2*  PLT 325 300 271 279    Cardiac Enzymes:  Recent Labs Lab 07/25/16 1804  TROPONINI 0.06*    Lipid Panel: No results for input(s): CHOL, TRIG, HDL, CHOLHDL, VLDL, LDLCALC in the last 168 hours.  CBG:  Recent Labs Lab 07/26/16 2222  GLUCAP 107*    Microbiology: Results for orders placed or performed during the hospital encounter of 07/25/16  Blood culture (routine x 2)     Status: None (Preliminary result)   Collection Time: 07/25/16  7:01 PM  Result Value Ref Range Status   Specimen Description BLOOD LEFT ARM  Final   Special Requests BOTTLES DRAWN AEROBIC AND ANAEROBIC 7CCAERO,7CCANA  Final   Culture NO GROWTH 2 DAYS  Final   Report Status PENDING  Incomplete  Blood culture (routine x 2)     Status: None (Preliminary result)   Collection Time: 07/25/16  7:01 PM  Result Value Ref Range Status   Specimen Description BLOOD LEFT ASSIST CONTROL  Final  Special Requests BOTTLES DRAWN AEROBIC AND ANAEROBIC 7CCAERO,5CCANA  Final   Culture NO GROWTH 2 DAYS  Final   Report Status PENDING  Incomplete  Urine culture     Status: Abnormal   Collection Time: 07/25/16  7:10 PM  Result Value Ref Range Status   Specimen Description URINE, RANDOM  Final   Special Requests NONE  Final   Culture MULTIPLE SPECIES PRESENT, SUGGEST RECOLLECTION (A)  Final   Report Status 07/27/2016 FINAL  Final  Blood culture (single)     Status: None (Preliminary result)   Collection Time: 07/25/16  8:34 PM  Result Value Ref Range  Status   Specimen Description BLOOD LEFT FOREARM  Final   Special Requests BOTTLES DRAWN AEROBIC AND ANAEROBIC 9CCAERO,8CCANA  Final   Culture NO GROWTH 2 DAYS  Final   Report Status PENDING  Incomplete  MRSA PCR Screening     Status: None   Collection Time: 07/26/16 10:31 PM  Result Value Ref Range Status   MRSA by PCR NEGATIVE NEGATIVE Final    Comment:        The GeneXpert MRSA Assay (FDA approved for NASAL specimens only), is one component of a comprehensive MRSA colonization surveillance program. It is not intended to diagnose MRSA infection nor to guide or monitor treatment for MRSA infections.     Coagulation Studies:  Recent Labs  07/25/16 1804  LABPROT 21.3*  INR 1.82    Imaging: No results found.  Medications:  I have reviewed the patient's current medications. Scheduled: . DULoxetine  30 mg Oral Daily  . fluconazole (DIFLUCAN) IV  400 mg Intravenous Q24H  . folic acid  1 mg Oral Daily  . multivitamin with minerals  1 tablet Oral Daily  . nicotine  21 mg Transdermal Daily  . piperacillin-tazobactam (ZOSYN)  IV  4.5 g Intravenous Q8H  . potassium chloride  40 mEq Oral Q4H  . sodium chloride flush  3 mL Intravenous Q12H  . sodium chloride flush  3 mL Intravenous Q12H  . thiamine  100 mg Oral Daily   Or  . thiamine  100 mg Intravenous Daily  . vancomycin  750 mg Intravenous Q12H  . warfarin  7.5 mg Oral ONCE-1800  . Warfarin - Physician Dosing Inpatient   Does not apply q1800    Assessment/Plan: Patient improved.  No longer on Precedex.  Coumadin re-initiated.  On Zosyn and Diflucan as well.  Unable to have MRI of the brain.  Due to patient improvement would not LP at this time but will treat for cardiac issues.  If worsening noted would consider LP at that time.  Would re-image with contrast  Recommendations: 1. Head CT with contrast to follow up initial abnormalities.       LOS: 3 days   Thana FarrLeslie Kimmi Acocella, MD Neurology [phone] 07/28/2016   11:09 AM

## 2016-07-28 NOTE — Progress Notes (Signed)
Pharmacy Antibiotic Note  Rachel Morrison is a 45 y.o. 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  Pharmacy has been consulted for vancomycin, Zosyn, and fluconazole dosing.  Plan: Vancomycin 750 mg q 12 hours ordered with stacked dosing. Level before 5th dose. Goal trough 15-20.  Zosyn 4.5 grams ordered for Pseudomonas risk of recent endocarditis episode in May.  Fluconazole 800 mg x1  ordered with 400 mg daily as continuation.   Height: 5\' 4"  (162.6 cm) Weight: 116 lb 13.5 oz (53 kg) IBW/kg (Calculated) : 54.7  Temp (24hrs), Avg:97.9 F (36.6 C), Min:97.5 F (36.4 C), Max:98.9 F (37.2 C)   Recent Labs Lab 07/25/16 1804 07/25/16 1820 07/25/16 2200 07/26/16 0037 07/27/16 0623 07/28/16 0042  WBC 7.5  --   --  6.2 7.3  --   CREATININE  --  0.84  --  0.86 0.83  --   LATICACIDVEN  --  2.0* 1.9  --   --   --   VANCOTROUGH  --   --   --   --   --  18    Estimated Creatinine Clearance: 70.9 mL/min (by C-G formula based on SCr of 0.83 mg/dL).    No Known Allergies  Antimicrobials this admission: vancomycin  8/12 >>  Zosyn 8/12 >>  Fluconazole 8/12 >>  Dose adjustments this admission:  8/14 00:30 vanc level 18. Continue current regimen   Microbiology results: 8/12 MRSA PCR: negative 8/11 BCx: NGTD 8/11 UCx: pending    8/11 CXR: no active disease 8/11 UA: (-)  Thank you for allowing pharmacy to be a part of this patient's care.  Monzerat Handler S 07/28/2016 1:42 AM

## 2016-07-28 NOTE — Progress Notes (Signed)
[facility] [place] at [facility]                                                                                                                                                                                            Patient Demographics   Rachel Morrison, is a 45 y.o. female, DOB - May 06, 1971, NWG:[id number]  Admit date - 07/25/2016   Admitting Physician Oralia Manis, MD  Outpatient Primary MD for the patient is No PCP Per Patient   LOS - 3  Subjective:Patient not agitated this morning eating breakfast states that she's feeling well  Review of Systems:   Review of Systems  Constitutional: Negative for chills, diaphoresis, fever, malaise/fatigue and weight loss.  HENT: Negative for congestion, ear discharge, ear pain, hearing loss, nosebleeds and tinnitus.   Eyes: Negative.  Negative for blurred vision, double vision, photophobia, pain and discharge.  Respiratory: Negative for cough, hemoptysis, sputum production and shortness of breath.   Cardiovascular: Negative.  Negative for chest pain, palpitations, orthopnea and claudication.  Gastrointestinal: Negative for abdominal pain, constipation, diarrhea, heartburn, nausea and vomiting.  Genitourinary: Negative for dysuria, frequency, hematuria and urgency.  Musculoskeletal: Positive for back pain.  Skin: Negative.  Negative for itching and rash.  Neurological: Negative for dizziness, tingling, tremors, sensory change, weakness and headaches.  Endo/Heme/Allergies: Negative for polydipsia.  Psychiatric/Behavioral: The patient is nervous/anxious.     Vitals:   Vitals:   07/28/16 0729 07/28/16 0800 07/28/16 0900 07/28/16 1000  BP: (!) 140/94 (!) 154/94 (!) 154/101 (!) 144/100  Pulse: 85 86 92 84  Resp: (!) 26 (!) 27 (!) 23 20  Temp: 98.9 F (37.2 C)     TempSrc: Axillary     SpO2: 100% 100% 100% 100%  Weight:      Height:        Wt Readings from Last 3 Encounters:  07/26/16 53 kg (116 lb 13.5 oz)      Intake/Output Summary (Last 24 hours) at 07/28/16 1122 Last data filed at 07/28/16 1113  Gross per 24 hour  Intake          2468.99 ml  Output             2725 ml  Net          -256.01 ml    Physical Exam:   GENERAL: Sedated HEAD, EYES, EARS, NOSE AND THROAT: Atraumatic, normocephalic. Extraocular muscles are intact. Pupils equal and reactive to light. Sclerae anicteric. No conjunctival injection. No oro-pharyngeal erythema.  NECK: Supple. There is no jugular venous distention. No bruits, no lymphadenopathy, no thyromegaly.  HEART: Regular rate and  rhythm,. No murmurs, no rubs, no clicks.  LUNGS: Clear to auscultation bilaterally. No rales or rhonchi. No wheezes.  ABDOMEN: Soft, flat, nontender, nondistended. Has good bowel sounds. No hepatosplenomegaly appreciated.  EXTREMITIES: No evidence of any cyanosis, clubbing, or peripheral edema.  +2 pedal and radial pulses bilaterally.  NEUROLOGIC: The patient is sedated  SKIN: Moist and warm with no rashes appreciated.  Psych: Sedated LN: No inguinal LN enlargement    Antibiotics   Anti-infectives    Start     Dose/Rate Route Frequency Ordered Stop   07/27/16 1000  fluconazole (DIFLUCAN) IVPB 400 mg     400 mg 100 mL/hr over 120 Minutes Intravenous Every 24 hours 07/26/16 0039     07/26/16 1300  vancomycin (VANCOCIN) IVPB 750 mg/150 ml premix     750 mg 150 mL/hr over 60 Minutes Intravenous Every 12 hours 07/26/16 0258     07/26/16 0200  piperacillin-tazobactam (ZOSYN) IVPB 4.5 g     4.5 g 25 mL/hr over 240 Minutes Intravenous Every 8 hours 07/26/16 0103     07/25/16 2245  piperacillin-tazobactam (ZOSYN) IVPB 4.5 g  Status:  Discontinued     4.5 g 200 mL/hr over 30 Minutes Intravenous Every 8 hours 07/25/16 2232 07/26/16 0103   07/25/16 2230  fluconazole (DIFLUCAN) IVPB 800 mg     800 mg 200 mL/hr over 120 Minutes Intravenous  Once 07/25/16 2229 07/26/16 0221   07/25/16 2230  vancomycin (VANCOCIN) IVPB 1000 mg/200 mL  premix     1,000 mg 200 mL/hr over 60 Minutes Intravenous  Once 07/25/16 2229 07/26/16 0300      Medications   Scheduled Meds: . DULoxetine  30 mg Oral Daily  . fluconazole (DIFLUCAN) IV  400 mg Intravenous Q24H  . folic acid  1 mg Oral Daily  . multivitamin with minerals  1 tablet Oral Daily  . nicotine  21 mg Transdermal Daily  . piperacillin-tazobactam (ZOSYN)  IV  4.5 g Intravenous Q8H  . potassium chloride  40 mEq Oral Q4H  . sodium chloride flush  3 mL Intravenous Q12H  . sodium chloride flush  3 mL Intravenous Q12H  . thiamine  100 mg Oral Daily   Or  . thiamine  100 mg Intravenous Daily  . vancomycin  750 mg Intravenous Q12H  . warfarin  7.5 mg Oral ONCE-1800  . Warfarin - Physician Dosing Inpatient   Does not apply q1800   Continuous Infusions: . heparin 850 Units/hr (07/28/16 1000)   PRN Meds:.acetaminophen **OR** acetaminophen, haloperidol lactate, LORazepam, morphine injection, ondansetron **OR** ondansetron (ZOFRAN) IV, sodium chloride flush   Data Review:   Micro Results Recent Results (from the past 240 hour(s))  Blood culture (routine x 2)     Status: None (Preliminary result)   Collection Time: 07/25/16  7:01 PM  Result Value Ref Range Status   Specimen Description BLOOD LEFT ARM  Final   Special Requests BOTTLES DRAWN AEROBIC AND ANAEROBIC 7CCAERO,7CCANA  Final   Culture NO GROWTH 2 DAYS  Final   Report Status PENDING  Incomplete  Blood culture (routine x 2)     Status: None (Preliminary result)   Collection Time: 07/25/16  7:01 PM  Result Value Ref Range Status   Specimen Description BLOOD LEFT ASSIST CONTROL  Final   Special Requests BOTTLES DRAWN AEROBIC AND ANAEROBIC 7CCAERO,5CCANA  Final   Culture NO GROWTH 2 DAYS  Final   Report Status PENDING  Incomplete  Urine culture     Status: Abnormal  Collection Time: 07/25/16  7:10 PM  Result Value Ref Range Status   Specimen Description URINE, RANDOM  Final   Special Requests NONE  Final    Culture MULTIPLE SPECIES PRESENT, SUGGEST RECOLLECTION (A)  Final   Report Status 07/27/2016 FINAL  Final  Blood culture (single)     Status: None (Preliminary result)   Collection Time: 07/25/16  8:34 PM  Result Value Ref Range Status   Specimen Description BLOOD LEFT FOREARM  Final   Special Requests BOTTLES DRAWN AEROBIC AND ANAEROBIC 9CCAERO,8CCANA  Final   Culture NO GROWTH 2 DAYS  Final   Report Status PENDING  Incomplete  MRSA PCR Screening     Status: None   Collection Time: 07/26/16 10:31 PM  Result Value Ref Range Status   MRSA by PCR NEGATIVE NEGATIVE Final    Comment:        The GeneXpert MRSA Assay (FDA approved for NASAL specimens only), is one component of a comprehensive MRSA colonization surveillance program. It is not intended to diagnose MRSA infection nor to guide or monitor treatment for MRSA infections.     Radiology Reports Dg Chest 1 View  Result Date: 07/25/2016 CLINICAL DATA:  Altered mental status. Recent mitral valve replacement. EXAM: CHEST 1 VIEW COMPARISON:  None. FINDINGS: Sternotomy wires appear aligned and intact. Mitral valve prosthesis is in place. Normal heart size. Normal mediastinal contour. No pneumothorax. No pleural effusion. Lungs appear clear, with no acute consolidative airspace disease and no pulmonary edema. IMPRESSION: No active disease. Electronically Signed   By: Delbert PhenixJason A Poff M.D.   On: 07/25/2016 18:40   Ct Head Wo Contrast  Result Date: 07/25/2016 CLINICAL DATA:  Altered mental status. Anticoagulated on Coumadin for recent mitral valve replacement. Confusion and hallucinations. EXAM: CT HEAD WITHOUT CONTRAST TECHNIQUE: Contiguous axial images were obtained from the base of the skull through the vertex without intravenous contrast. COMPARISON:  None. FINDINGS: There is a large focus of encephalomalacia in the medial right occipital lobe with associated ex vacuo dilatation of the occipital horn of the right lateral ventricle. There is  a small focus of encephalomalacia in the left superior cerebellar hemisphere. There is a hypoechoic focus in the right thalamus. No evidence of parenchymal hemorrhage or extra-axial fluid collection. No mass lesion, mass effect, or midline shift. Otherwise no ventriculomegaly. The visualized paranasal sinuses are essentially clear. The mastoid air cells are unopacified. No evidence of calvarial fracture. IMPRESSION: 1. Large focus of encephalomalacia in the medial right occipital lobe with ex vacuo dilatation of the occipital horn of the right lateral ventricle, most suggestive of a chronic infarct. 2. Small focus of encephalomalacia in the left superior cerebellar hemisphere, most suggestive of a late subacute to chronic infarct. 3. Hypoechoic focus in the right thalamus, suggesting an infarct of uncertain chronicity, probably subacute to chronic. Correlate with brain MRI as clinically warranted. 4. No acute intracranial hemorrhage. No significant mass effect or midline shift. Electronically Signed   By: Delbert PhenixJason A Poff M.D.   On: 07/25/2016 18:50     CBC  Recent Labs Lab 07/25/16 1804 07/26/16 0037 07/27/16 0623 07/28/16 0303  WBC 7.5 6.2 7.3 6.5  HGB 10.6* 10.1* 9.8* 9.6*  HCT 32.3* 29.9* 29.6* 28.6*  PLT 325 300 271 279  MCV 78.9* 77.8* 79.4* 78.2*  MCH 25.8* 26.2 26.1 26.4  MCHC 32.7 33.6 32.9 33.7  RDW 18.8* 18.3* 18.3* 18.6*  LYMPHSABS 2.7  --   --   --   MONOABS 0.7  --   --   --  EOSABS 0.1  --   --   --   BASOSABS 0.0  --   --   --     Chemistries   Recent Labs Lab 07/25/16 1820 07/26/16 0037 07/27/16 0623 07/28/16 0303  NA 136 136 139 138  K 3.8 3.2* 3.0* 3.0*  CL 103 103 109 109  CO2 23 24 25 25   GLUCOSE 92 132* 131* 113*  BUN 19 17 8 10   CREATININE 0.84 0.86 0.83 0.87  CALCIUM 9.6 8.8* 8.5* 8.2*  AST 52*  --   --   --   ALT 28  --   --   --   ALKPHOS 109  --   --   --   BILITOT 0.5  --   --   --     ------------------------------------------------------------------------------------------------------------------ estimated creatinine clearance is 67.6 mL/min (by C-G formula based on SCr of 0.87 mg/dL). ------------------------------------------------------------------------------------------------------------------ No results for input(s): HGBA1C in the last 72 hours. ------------------------------------------------------------------------------------------------------------------ No results for input(s): CHOL, HDL, LDLCALC, TRIG, CHOLHDL, LDLDIRECT in the last 72 hours. ------------------------------------------------------------------------------------------------------------------ No results for input(s): TSH, T4TOTAL, T3FREE, THYROIDAB in the last 72 hours.  Invalid input(s): FREET3 ------------------------------------------------------------------------------------------------------------------ No results for input(s): VITAMINB12, FOLATE, FERRITIN, TIBC, IRON, RETICCTPCT in the last 72 hours.  Coagulation profile  Recent Labs Lab 07/25/16 1804  INR 1.82    No results for input(s): DDIMER in the last 72 hours.  Cardiac Enzymes  Recent Labs Lab 07/25/16 1804  TROPONINI 0.06*   ------------------------------------------------------------------------------------------------------------------ Invalid input(s): POCBNP    Assessment & Plan  Patient is a 45 year old with history of endocarditis with mechanical mitral valve replacement  1. Acute encephalopathy and agitation Suspect related to possible benzo withdrawal Much improved Precedex has been stopped  Continue previous anxiolytics   2. . Mechanical Mitral value replacement Patient with multiple abnormalities noted on the CT scan suggestive of old infarcts Patient's INR is subtherapeutic Continue IV heparin I will start her on Coumadin back  3. Hypokalemia replace orally potassium Check magnesium  4.  Anxiety disorder with acute exasperation improved as above  5. History of endocarditis Appreciate ID input patient afebrile Blood cultures negative Was previously on antifungal medications I have asked Dr. Sampson GoonFitzgerald to review her records and to see if the antibiotics can be discontinued     All the records are reviewed and case discussed with ED provider.     Code Status Orders        Start     Ordered   07/25/16 2320  Full code  Continuous     07/25/16 2319    Code Status History    Date Active Date Inactive Code Status Order ID Comments User Context   This patient has a current code status but no historical code status.           Consults Neurology, infectious disease, psychiatry DVT Prophylaxis heparin drip  Lab Results  Component Value Date   PLT 279 07/28/2016     Time Spent in minutes  32min .   Auburn BilberryPATEL, Zariah Cavendish M.D on 07/28/2016 at 11:22 AM  Between 7am to 6pm - Pager - [phone]  After 6pm go to [email or web address] - password [email or web address] [facility]  [facility]   Office  [phone]

## 2016-07-28 NOTE — Progress Notes (Signed)
[facility]  Patient currently resting quietly, agreeable to transfer, heparin and abx infusing.

## 2016-07-28 NOTE — Progress Notes (Signed)
[facility]  SUBJECTIVE: Much better this am. Appears alert and much more oriianted. Discussed case with patient and her significant other. Pt has apparently been fairly compliant with her meds including warfarin and diflucan. She has been taking both until early last week when her inr was 3.9 and she held coumadin for one day. She developed nasuea and vomiting for 2 days prior to her presentation and her friend is unsure if the pills stayed down. Denis illicit drug use.    Vitals:   07/28/16 0400 07/28/16 0500 07/28/16 0600 07/28/16 0729  BP: (!) 148/104 123/86  (!) 140/94  Pulse: 73 89 72 85  Resp: 15 (!) 25 15 (!) 26  Temp:    98.9 F (37.2 C)  TempSrc:    Axillary  SpO2: 100% 100% 100% 100%  Weight:      Height:        Intake/Output Summary (Last 24 hours) at 07/28/16 0920 Last data filed at 07/28/16 0800  Gross per 24 hour  Intake          1338.56 ml  Output             2725 ml  Net         -1386.44 ml    LABS: Basic Metabolic Panel:  Recent Labs  04/54/0908/13/17 0623 07/28/16 0303  NA 139 138  K 3.0* 3.0*  CL 109 109  CO2 25 25  GLUCOSE 131* 113*  BUN 8 10  CREATININE 0.83 0.87  CALCIUM 8.5* 8.2*   Liver Function Tests:  Recent Labs  07/25/16 1820  AST 52*  ALT 28  ALKPHOS 109  BILITOT 0.5  PROT 8.2*  ALBUMIN 4.2    Recent Labs  07/25/16 1820  LIPASE 65*   CBC:  Recent Labs  07/25/16 1804  07/27/16 0623 07/28/16 0303  WBC 7.5  < > 7.3 6.5  NEUTROABS 3.9  --   --   --   HGB 10.6*  < > 9.8* 9.6*  HCT 32.3*  < > 29.6* 28.6*  MCV 78.9*  < > 79.4* 78.2*  PLT 325  < > 271 279  < > = values in this interval not displayed. Cardiac Enzymes:  Recent Labs  07/25/16 1804  TROPONINI 0.06*   BNP: Invalid input(s): POCBNP D-Dimer: No results for input(s): DDIMER in the last 72 hours. Hemoglobin A1C: No results for input(s): HGBA1C in the last 72 hours. Fasting Lipid Panel: No results for input(s): CHOL, HDL,  LDLCALC, TRIG, CHOLHDL, LDLDIRECT in the last 72 hours. Thyroid Function Tests: No results for input(s): TSH, T4TOTAL, T3FREE, THYROIDAB in the last 72 hours.  Invalid input(s): FREET3 Anemia Panel: No results for input(s): VITAMINB12, FOLATE, FERRITIN, TIBC, IRON, RETICCTPCT in the last 72 hours.   Physical Exam: Blood pressure (!) 140/94, pulse 85, temperature 98.9 F (37.2 C), temperature source Axillary, resp. rate (!) 26, height 5\' 4"  (1.626 m), weight 53 kg (116 lb 13.5 oz), last menstrual period 07/09/2016, SpO2 100 %.   Wt Readings from Last 1 Encounters:  07/26/16 53 kg (116 lb 13.5 oz)     General appearance: alert and cooperative Head: Normocephalic, without obvious abnormality, atraumatic Resp: clear to auscultation bilaterally Cardio: regular with metallic heart sounds GI: soft, non-tender; bowel sounds normal; no masses,  no organomegaly Extremities: extremities normal, atraumatic, no cyanosis or edema Neurologic: Grossly normal  TELEMETRY: Reviewed telemetry pt in nsr:  ASSESSMENT AND PLAN:  Principal Problem:   Acute delirium-appears  to have resolved at present. Etiology unclear.  Active Problems:   Altered mental status-will need continued treatment with anxiolytics. Reviewed transthoracic echo. LV function is normal . Bileaflet mitral prosthesis apppears to be funcitoning well. No significant mr. No obvious vegetations or thrombi. WIll need tee to further evaluate valve. Scheduled for tomorrow am.    Brain lesion-will continue with diflucan and start warfarin. Keep on heparin until therapeutic. Tee to evaluate for cse.    Anxiety   H/O endocarditis-does not appear fungemic or bactaremic   Altered mental state-imrproved   Opiate abuse, episodic   Acute cerebral infarction (HCC)-as per above Hypokalemic-repleat to get greater than 4.     Dalia HeadingFATH,Marlana Mckowen A., MD, [facility] Hospital[facility] 07/28/2016 9:20 AM

## 2016-07-29 ENCOUNTER — Encounter
Admission: EM | Disposition: A | Discharge status: Home / Self Care | Payer: Self-pay | Source: Home / Self Care | Attending: Internal Medicine

## 2016-07-29 ENCOUNTER — Inpatient Hospital Stay
Admit: 2016-07-29 | Discharge: 2016-07-29 | Disposition: A | Discharge status: Home / Self Care | Payer: Self-pay | Attending: Cardiology | Admitting: Cardiology

## 2016-07-29 ENCOUNTER — Inpatient Hospital Stay: Payer: Self-pay

## 2016-07-29 ENCOUNTER — Encounter: Payer: Self-pay | Admitting: Radiology

## 2016-07-29 HISTORY — PX: TEE WITHOUT CARDIOVERSION: SHX5443

## 2016-07-29 LAB — PROTIME-INR
INR: 1.22
Prothrombin Time: 15.5 seconds — ABNORMAL HIGH (ref 11.4–15.2)

## 2016-07-29 LAB — BASIC METABOLIC PANEL
Anion gap: 5 (ref 5–15)
BUN: 10 mg/dL (ref 6–20)
CHLORIDE: 111 mmol/L (ref 101–111)
CO2: 22 mmol/L (ref 22–32)
CREATININE: 0.79 mg/dL (ref 0.44–1.00)
Calcium: 8.7 mg/dL — ABNORMAL LOW (ref 8.9–10.3)
GFR calc Af Amer: >60 mL/min (ref >60)
GFR calc non Af Amer: >60 mL/min (ref >60)
Glucose, Bld: 90 mg/dL (ref 65–99)
Potassium: 4.5 mmol/L (ref 3.5–5.1)
Sodium: 138 mmol/L (ref 135–145)

## 2016-07-29 LAB — CBC
HCT: 31.9 % — ABNORMAL LOW (ref 35.0–47.0)
Hemoglobin: 10.6 g/dL — ABNORMAL LOW (ref 12.0–16.0)
MCH: 26.2 pg (ref 26.0–34.0)
MCHC: 33.1 g/dL (ref 32.0–36.0)
MCV: 79.3 fL — AB (ref 80.0–100.0)
PLATELETS: 305 10*3/uL (ref 150–440)
RBC: 4.03 MIL/uL (ref 3.80–5.20)
RDW: 19.7 % — AB (ref 11.5–14.5)
WBC: 6.9 10*3/uL (ref 3.6–11.0)

## 2016-07-29 LAB — HEPARIN LEVEL (UNFRACTIONATED): Heparin Unfractionated: 0.42 IU/mL (ref 0.30–0.70)

## 2016-07-29 SURGERY — ECHOCARDIOGRAM, TRANSESOPHAGEAL
Anesthesia: Moderate Sedation

## 2016-07-29 MED ORDER — FENTANYL CITRATE (PF) 100 MCG/2ML IJ SOLN
INTRAMUSCULAR | Status: AC | PRN
  Administered 2016-07-29: 50 ug via INTRAVENOUS
  Administered 2016-07-29: 25 ug via INTRAVENOUS

## 2016-07-29 MED ORDER — MIDAZOLAM HCL 2 MG/2ML IJ SOLN
INTRAMUSCULAR | Status: AC | PRN
  Administered 2016-07-29: 1 mg via INTRAVENOUS

## 2016-07-29 MED ORDER — BUTAMBEN-TETRACAINE-BENZOCAINE 2-2-14 % EX AERO
INHALATION_SPRAY | CUTANEOUS | Status: AC
  Filled 2016-07-29: qty 20

## 2016-07-29 MED ORDER — LIDOCAINE VISCOUS 2 % MT SOLN
OROMUCOSAL | Status: AC
  Filled 2016-07-29: qty 15

## 2016-07-29 MED ORDER — FENTANYL CITRATE (PF) 100 MCG/2ML IJ SOLN
INTRAMUSCULAR | Status: AC
  Filled 2016-07-29: qty 2

## 2016-07-29 MED ORDER — MIDAZOLAM HCL 5 MG/5ML IJ SOLN
INTRAMUSCULAR | Status: AC
  Filled 2016-07-29: qty 5

## 2016-07-29 MED ORDER — IOPAMIDOL (ISOVUE-300) INJECTION 61%
75.0000 mL | Freq: Once | INTRAVENOUS | Status: AC | PRN
  Administered 2016-07-29: 75 mL via INTRAVENOUS

## 2016-07-29 MED ORDER — WARFARIN SODIUM 1 MG PO TABS
6.0000 mg | ORAL_TABLET | Freq: Once | ORAL | Status: AC
  Administered 2016-07-29: 6 mg via ORAL
  Filled 2016-07-29: qty 1

## 2016-07-29 NOTE — Progress Notes (Signed)
[facility] - [place] at [facility]                                                                                                                                                                                            Patient Demographics   Rachel Morrison, is a 45 y.o. female, DOB - 07-06-1971, YNW:[id number]  Admit date - 07/25/2016   Admitting Physician Oralia Manis, MD  Outpatient Primary MD for the patient is No PCP Per Patient   LOS - 4  Subjective:Doing well denies any complaints and wants to know when she can go home TEE shows no thrombus  Review of Systems:   Review of Systems  Constitutional: Negative for chills, diaphoresis, fever, malaise/fatigue and weight loss.  HENT: Negative for congestion, ear discharge, ear pain, hearing loss, nosebleeds and tinnitus.   Eyes: Negative.  Negative for blurred vision, double vision, photophobia, pain and discharge.  Respiratory: Negative for cough, hemoptysis, sputum production and shortness of breath.   Cardiovascular: Negative.  Negative for chest pain, palpitations, orthopnea and claudication.  Gastrointestinal: Negative for abdominal pain, constipation, diarrhea, heartburn, nausea and vomiting.  Genitourinary: Negative for dysuria, frequency, hematuria and urgency.  Musculoskeletal: Positive for back pain.  Skin: Negative.  Negative for itching and rash.  Neurological: Negative for dizziness, tingling, tremors, sensory change, weakness and headaches.  Endo/Heme/Allergies: Negative for polydipsia.  Psychiatric/Behavioral: The patient is nervous/anxious.     Vitals:   Vitals:   07/29/16 1000 07/29/16 1015 07/29/16 1030 07/29/16 1145  BP: (!) 133/92  (!) 155/97 (!) 150/86  Pulse: 79 88 89 83  Resp: 15 18 18  (!) 22  Temp:    97.8 F (36.6 C)  TempSrc:    Oral  SpO2: 100% 100% 98% 100%  Weight:      Height:        Wt Readings from Last 3 Encounters:  07/29/16 54.2 kg (119 lb 6.4 oz)      Intake/Output Summary (Last 24 hours) at 07/29/16 1348 Last data filed at 07/29/16 1132  Gross per 24 hour  Intake          3554.22 ml  Output             1500 ml  Net          2054.22 ml    Physical Exam:   GENERAL: Sedated HEAD, EYES, EARS, NOSE AND THROAT: Atraumatic, normocephalic. Extraocular muscles are intact. Pupils equal and reactive to light. Sclerae anicteric. No conjunctival injection. No oro-pharyngeal erythema.  NECK: Supple. There is no jugular venous distention. No bruits, no lymphadenopathy, no thyromegaly.  HEART:  Regular rate and rhythm,. No murmurs, no rubs, no clicks.  LUNGS: Clear to auscultation bilaterally. No rales or rhonchi. No wheezes.  ABDOMEN: Soft, flat, nontender, nondistended. Has good bowel sounds. No hepatosplenomegaly appreciated.  EXTREMITIES: No evidence of any cyanosis, clubbing, or peripheral edema.  +2 pedal and radial pulses bilaterally.  NEUROLOGIC: The patient is sedated  SKIN: Moist and warm with no rashes appreciated.  Psych: Sedated LN: No inguinal LN enlargement    Antibiotics   Anti-infectives    Start     Dose/Rate Route Frequency Ordered Stop   07/27/16 1000  fluconazole (DIFLUCAN) IVPB 400 mg     400 mg 100 mL/hr over 120 Minutes Intravenous Every 24 hours 07/26/16 0039     07/26/16 1300  vancomycin (VANCOCIN) IVPB 750 mg/150 ml premix  Status:  Discontinued     750 mg 150 mL/hr over 60 Minutes Intravenous Every 12 hours 07/26/16 0258 07/28/16 1639   07/26/16 0200  piperacillin-tazobactam (ZOSYN) IVPB 4.5 g  Status:  Discontinued     4.5 g 25 mL/hr over 240 Minutes Intravenous Every 8 hours 07/26/16 0103 07/28/16 1639   07/25/16 2245  piperacillin-tazobactam (ZOSYN) IVPB 4.5 g  Status:  Discontinued     4.5 g 200 mL/hr over 30 Minutes Intravenous Every 8 hours 07/25/16 2232 07/26/16 0103   07/25/16 2230  fluconazole (DIFLUCAN) IVPB 800 mg     800 mg 200 mL/hr over 120 Minutes Intravenous  Once 07/25/16 2229 07/26/16  0221   07/25/16 2230  vancomycin (VANCOCIN) IVPB 1000 mg/200 mL premix     1,000 mg 200 mL/hr over 60 Minutes Intravenous  Once 07/25/16 2229 07/26/16 0300      Medications   Scheduled Meds: . butamben-tetracaine-benzocaine      . DULoxetine  30 mg Oral Daily  . fentaNYL      . fluconazole (DIFLUCAN) IV  400 mg Intravenous Q24H  . folic acid  1 mg Oral Daily  . lidocaine      . midazolam      . multivitamin with minerals  1 tablet Oral Daily  . nicotine  21 mg Transdermal Daily  . sodium chloride flush  3 mL Intravenous Q12H  . sodium chloride flush  3 mL Intravenous Q12H  . thiamine  100 mg Oral Daily   Or  . thiamine  100 mg Intravenous Daily  . Warfarin - Physician Dosing Inpatient   Does not apply q1800   Continuous Infusions: . heparin 850 Units/hr (07/28/16 1000)   PRN Meds:.acetaminophen **OR** acetaminophen, haloperidol lactate, LORazepam, morphine injection, ondansetron **OR** ondansetron (ZOFRAN) IV, oxyCODONE-acetaminophen, sodium chloride flush   Data Review:   Micro Results Recent Results (from the past 240 hour(s))  Blood culture (routine x 2)     Status: None (Preliminary result)   Collection Time: 07/25/16  7:01 PM  Result Value Ref Range Status   Specimen Description BLOOD LEFT ARM  Final   Special Requests BOTTLES DRAWN AEROBIC AND ANAEROBIC 7CCAERO,7CCANA  Final   Culture NO GROWTH 4 DAYS  Final   Report Status PENDING  Incomplete  Blood culture (routine x 2)     Status: None (Preliminary result)   Collection Time: 07/25/16  7:01 PM  Result Value Ref Range Status   Specimen Description BLOOD LEFT ASSIST CONTROL  Final   Special Requests BOTTLES DRAWN AEROBIC AND ANAEROBIC [id number],5CCANA  Final   Culture NO GROWTH 4 DAYS  Final   Report Status PENDING  Incomplete  Urine culture  Status: Abnormal   Collection Time: 07/25/16  7:10 PM  Result Value Ref Range Status   Specimen Description URINE, RANDOM  Final   Special Requests NONE  Final    Culture MULTIPLE SPECIES PRESENT, SUGGEST RECOLLECTION (A)  Final   Report Status 07/27/2016 FINAL  Final  Blood culture (single)     Status: None (Preliminary result)   Collection Time: 07/25/16  8:34 PM  Result Value Ref Range Status   Specimen Description BLOOD LEFT FOREARM  Final   Special Requests BOTTLES DRAWN AEROBIC AND ANAEROBIC 9CCAERO,8CCANA  Final   Culture NO GROWTH 4 DAYS  Final   Report Status PENDING  Incomplete  MRSA PCR Screening     Status: None   Collection Time: 07/26/16 10:31 PM  Result Value Ref Range Status   MRSA by PCR NEGATIVE NEGATIVE Final    Comment:        The GeneXpert MRSA Assay (FDA approved for NASAL specimens only), is one component of a comprehensive MRSA colonization surveillance program. It is not intended to diagnose MRSA infection nor to guide or monitor treatment for MRSA infections.     Radiology Reports Dg Chest 1 View  Result Date: 07/25/2016 CLINICAL DATA:  Altered mental status. Recent mitral valve replacement. EXAM: CHEST 1 VIEW COMPARISON:  None. FINDINGS: Sternotomy wires appear aligned and intact. Mitral valve prosthesis is in place. Normal heart size. Normal mediastinal contour. No pneumothorax. No pleural effusion. Lungs appear clear, with no acute consolidative airspace disease and no pulmonary edema. IMPRESSION: No active disease. Electronically Signed   By: Delbert Phenix M.D.   On: 07/25/2016 18:40   Ct Head Wo Contrast  Result Date: 07/25/2016 CLINICAL DATA:  Altered mental status. Anticoagulated on Coumadin for recent mitral valve replacement. Confusion and hallucinations. EXAM: CT HEAD WITHOUT CONTRAST TECHNIQUE: Contiguous axial images were obtained from the base of the skull through the vertex without intravenous contrast. COMPARISON:  None. FINDINGS: There is a large focus of encephalomalacia in the medial right occipital lobe with associated ex vacuo dilatation of the occipital horn of the right lateral ventricle. There is  a small focus of encephalomalacia in the left superior cerebellar hemisphere. There is a hypoechoic focus in the right thalamus. No evidence of parenchymal hemorrhage or extra-axial fluid collection. No mass lesion, mass effect, or midline shift. Otherwise no ventriculomegaly. The visualized paranasal sinuses are essentially clear. The mastoid air cells are unopacified. No evidence of calvarial fracture. IMPRESSION: 1. Large focus of encephalomalacia in the medial right occipital lobe with ex vacuo dilatation of the occipital horn of the right lateral ventricle, most suggestive of a chronic infarct. 2. Small focus of encephalomalacia in the left superior cerebellar hemisphere, most suggestive of a late subacute to chronic infarct. 3. Hypoechoic focus in the right thalamus, suggesting an infarct of uncertain chronicity, probably subacute to chronic. Correlate with brain MRI as clinically warranted. 4. No acute intracranial hemorrhage. No significant mass effect or midline shift. Electronically Signed   By: Delbert Phenix M.D.   On: 07/25/2016 18:50     CBC  Recent Labs Lab 07/25/16 1804 07/26/16 0037 07/27/16 0623 07/28/16 0303 07/29/16 0426  WBC 7.5 6.2 7.3 6.5 6.9  HGB 10.6* 10.1* 9.8* 9.6* 10.6*  HCT 32.3* 29.9* 29.6* 28.6* 31.9*  PLT 325 300 271 279 305  MCV 78.9* 77.8* 79.4* 78.2* 79.3*  MCH 25.8* 26.2 26.1 26.4 26.2  MCHC 32.7 33.6 32.9 33.7 33.1  RDW 18.8* 18.3* 18.3* 18.6* 19.7*  LYMPHSABS 2.7  --   --   --   --  MONOABS 0.7  --   --   --   --   EOSABS 0.1  --   --   --   --   BASOSABS 0.0  --   --   --   --     Chemistries   Recent Labs Lab 07/25/16 1820 07/26/16 0037 07/27/16 0623 07/28/16 0303 07/29/16 0426  NA 136 136 139 138 138  K 3.8 3.2* 3.0* 3.0* 4.5  CL 103 103 109 109 111  CO2 23 24 25 25 22   GLUCOSE 92 132* 131* 113* 90  BUN 19 17 8 10 10   CREATININE 0.84 0.86 0.83 0.87 0.79  CALCIUM 9.6 8.8* 8.5* 8.2* 8.7*  MG  --   --   --  2.0  --   AST 52*  --   --    --   --   ALT 28  --   --   --   --   ALKPHOS 109  --   --   --   --   BILITOT 0.5  --   --   --   --    ------------------------------------------------------------------------------------------------------------------ estimated creatinine clearance is 75.2 mL/min (by C-G formula based on SCr of 0.8 mg/dL). ------------------------------------------------------------------------------------------------------------------ No results for input(s): HGBA1C in the last 72 hours. ------------------------------------------------------------------------------------------------------------------ No results for input(s): CHOL, HDL, LDLCALC, TRIG, CHOLHDL, LDLDIRECT in the last 72 hours. ------------------------------------------------------------------------------------------------------------------ No results for input(s): TSH, T4TOTAL, T3FREE, THYROIDAB in the last 72 hours.  Invalid input(s): FREET3 ------------------------------------------------------------------------------------------------------------------ No results for input(s): VITAMINB12, FOLATE, FERRITIN, TIBC, IRON, RETICCTPCT in the last 72 hours.  Coagulation profile  Recent Labs Lab 07/25/16 1804 07/29/16 0426  INR 1.82 1.22    No results for input(s): DDIMER in the last 72 hours.  Cardiac Enzymes  Recent Labs Lab 07/25/16 1804  TROPONINI 0.06*   ------------------------------------------------------------------------------------------------------------------ Invalid input(s): POCBNP    Assessment & Plan  Patient is a 45 year old with history of endocarditis with mechanical mitral valve replacement  1. Acute encephalopathy and agitation Suspect related to possible benzo withdrawalNow resolved anxiolytics   2. . Mechanical Mitral value replacement Patient with multiple abnormalities noted on the CT scan suggestive of old infarcts Patient's INR is subtherapeutic We'll continue IV heparin Continue  Coumadin TEE without any thrombus  3. Hypokalemia replaced   4. Anxiety disorder with acute exasperation improved as above  5. History of endocarditis Appreciate ID input patient afebrile Blood cultures negative DC all antibiotics except antifungals which she was on previously    All the records are reviewed and case discussed with ED provider.     Code Status Orders        Start     Ordered   07/25/16 2320  Full code  Continuous     07/25/16 2319    Code Status History    Date Active Date Inactive Code Status Order ID Comments User Context   This patient has a current code status but no historical code status.           Consults Neurology, infectious disease, psychiatry DVT Prophylaxis heparin drip  Lab Results  Component Value Date   PLT 305 07/29/2016     Time Spent in minutes  25min .   Auburn BilberryPATEL, Leannah Guse M.D on 07/29/2016 at 1:48 PM  Between 7am to 6pm - Pager - [phone]  After 6pm go to [email or web address] - password [email or web address] [facility]  [facility]   Office  [phone]

## 2016-07-29 NOTE — Progress Notes (Signed)
[facility]  SUBJECTIVE: No complaints   Vitals:   07/29/16 0505 07/29/16 0832 07/29/16 0845 07/29/16 0851  BP: (!) 151/98 (!) 153/99 (!) 141/88   Pulse: 82 85 80 87  Resp: 16 18 16 16   Temp: 98.3 F (36.8 C) 98.5 F (36.9 C)    TempSrc: Oral     SpO2: 99% 98% 100% 100%  Weight: 54.2 kg (119 lb 6.4 oz)     Height:        Intake/Output Summary (Last 24 hours) at 07/29/16 0906 Last data filed at 07/29/16 0500  Gross per 24 hour  Intake          3838.87 ml  Output             1500 ml  Net          2338.87 ml    LABS: Basic Metabolic Panel:  Recent Labs  16/09/9607/14/17 0303 07/29/16 0426  NA 138 138  K 3.0* 4.5  CL 109 111  CO2 25 22  GLUCOSE 113* 90  BUN 10 10  CREATININE 0.87 0.79  CALCIUM 8.2* 8.7*  MG 2.0  --    Liver Function Tests: No results for input(s): AST, ALT, ALKPHOS, BILITOT, PROT, ALBUMIN in the last 72 hours. No results for input(s): LIPASE, AMYLASE in the last 72 hours. CBC:  Recent Labs  07/28/16 0303 07/29/16 0426  WBC 6.5 6.9  HGB 9.6* 10.6*  HCT 28.6* 31.9*  MCV 78.2* 79.3*  PLT 279 305   Cardiac Enzymes: No results for input(s): CKTOTAL, CKMB, CKMBINDEX, TROPONINI in the last 72 hours. BNP: Invalid input(s): POCBNP D-Dimer: No results for input(s): DDIMER in the last 72 hours. Hemoglobin A1C: No results for input(s): HGBA1C in the last 72 hours. Fasting Lipid Panel: No results for input(s): CHOL, HDL, LDLCALC, TRIG, CHOLHDL, LDLDIRECT in the last 72 hours. Thyroid Function Tests: No results for input(s): TSH, T4TOTAL, T3FREE, THYROIDAB in the last 72 hours.  Invalid input(s): FREET3 Anemia Panel: No results for input(s): VITAMINB12, FOLATE, FERRITIN, TIBC, IRON, RETICCTPCT in the last 72 hours.   Physical Exam: Blood pressure (!) 141/88, pulse 87, temperature 98.5 F (36.9 C), resp. rate 16, height 5\' 4"  (1.626 m), weight 54.2 kg (119 lb 6.4 oz), last menstrual period 07/09/2016, SpO2 100  %.   Wt Readings from Last 1 Encounters:  07/29/16 54.2 kg (119 lb 6.4 oz)     General appearance: alert and cooperative Resp: clear to auscultation bilaterally Cardio: regular rate and rhythm GI: soft, non-tender; bowel sounds normal; no masses,  no organomegaly Neurologic: Grossly normal  TELEMETRY: Reviewed telemetry pt in nsr  ASSESSMENT AND PLAN:  Principal Problem:   Acute delirium-improved. Active Problems:   Altered mental status   Brain lesion   Anxiety   H/O endocarditis-no evidence of vegetaiton or thrombus on mitral prosthesis or aortic valve. No evidence of cardiac source of embolus.    Altered mental state   Opiate abuse, episodic   Acute cerebral infarction [facility](HCC)    Dalia Morrison,Rachel Kita A., MD, [facility] 07/29/2016 9:06 AM

## 2016-07-29 NOTE — Progress Notes (Signed)
[facility] CLINIC INFECTIOUS DISEASE PROGRESS NOTE Date of Admission:  07/25/2016     ID: Rachel Morrison is a 45 y.o. female with MV endocarditis admit with AMS Principal Problem:   Acute delirium Active Problems:   Altered mental status   Brain lesion   Anxiety   H/O endocarditis   Altered mental state   Opiate abuse, episodic   Acute cerebral infarction [facility])   Subjective: Doing better, more alert  ROS  Eleven systems are reviewed and negative except per hpi  Medications:  Antibiotics Given (last 72 hours)    Date/Time Action Medication Dose Rate   07/26/16 1849 Given   piperacillin-tazobactam (ZOSYN) IVPB 4.5 g 4.5 g 25 mL/hr   07/27/16 0237 Given   vancomycin (VANCOCIN) IVPB 750 mg/150 ml premix 750 mg 150 mL/hr   07/27/16 0422 Given   piperacillin-tazobactam (ZOSYN) IVPB 4.5 g 4.5 g 25 mL/hr   07/27/16 0915 Given   piperacillin-tazobactam (ZOSYN) IVPB 4.5 g 4.5 g 25 mL/hr   07/27/16 1326 Given   vancomycin (VANCOCIN) IVPB 750 mg/150 ml premix 750 mg 150 mL/hr   07/27/16 1711 Given   piperacillin-tazobactam (ZOSYN) IVPB 4.5 g 4.5 g 25 mL/hr   07/28/16 0121 Given   vancomycin (VANCOCIN) IVPB 750 mg/150 ml premix 750 mg 150 mL/hr   07/28/16 0228 Given   piperacillin-tazobactam (ZOSYN) IVPB 4.5 g 4.5 g 25 mL/hr   07/28/16 0930 Given   piperacillin-tazobactam (ZOSYN) IVPB 4.5 g 4.5 g 25 mL/hr   07/28/16 1339 Given   vancomycin (VANCOCIN) IVPB 750 mg/150 ml premix 750 mg 150 mL/hr     . butamben-tetracaine-benzocaine      . DULoxetine  30 mg Oral Daily  . fentaNYL      . fluconazole (DIFLUCAN) IV  400 mg Intravenous Q24H  . folic acid  1 mg Oral Daily  . lidocaine      . midazolam      . multivitamin with minerals  1 tablet Oral Daily  . nicotine  21 mg Transdermal Daily  . sodium chloride flush  3 mL Intravenous Q12H  . sodium chloride flush  3 mL Intravenous Q12H  . thiamine  100 mg Oral Daily   Or  . thiamine  100 mg Intravenous Daily  . warfarin  6 mg Oral  ONCE-1800  . Warfarin - Physician Dosing Inpatient   Does not apply q1800    Objective: Vital signs in last 24 hours: Temp:  [97.8 F (36.6 C)-98.5 F (36.9 C)] 97.8 F (36.6 C) (08/15 1145) Pulse Rate:  [78-90] 83 (08/15 1145) Resp:  [14-22] 22 (08/15 1145) BP: (132-155)/(82-99) 150/86 (08/15 1145) SpO2:  [98 %-100 %] 100 % (08/15 1145) Weight:  [54.2 kg (119 lb 6.4 oz)] 54.2 kg (119 lb 6.4 oz) (08/15 0505) Constitutional:  oriented to person, place, and time. appears well-developed and well-nourished. No distress.  HENT: [place]/AT, PERRLA, no scleral icterus Mouth/Throat: Oropharynx is clear and moist. No oropharyngeal exudate.  Cardiovascular: Normal rate, regular rhythm and normal heart sounds. Mech HS  Pulmonary/Chest: Effort normal and breath sounds normal. No respiratory distress.   Neck = supple, no nuchal rigidity Abdominal: Soft. Bowel sounds are normal.  exhibits no distension. There is no tenderness.  Lymphadenopathy: no cervical adenopathy. No axillary adenopathy Neurological: alert and oriented to person, place, and time.  Skin: multipel scabs on UE.  Psychiatric: a normal mood and affect.  behavior is normal.   Lab Results  Recent Labs  07/28/16 0303 07/29/16 0426  WBC  6.5 6.9  HGB 9.6* 10.6*  HCT 28.6* 31.9*  NA 138 138  K 3.0* 4.5  CL 109 111  CO2 25 22  BUN 10 10  CREATININE 0.87 0.79    Microbiology: Results for orders placed or performed during the hospital encounter of 07/25/16  Blood culture (routine x 2)     Status: None (Preliminary result)   Collection Time: 07/25/16  7:01 PM  Result Value Ref Range Status   Specimen Description BLOOD LEFT ARM  Final   Special Requests BOTTLES DRAWN AEROBIC AND ANAEROBIC 7CCAERO,7CCANA  Final   Culture NO GROWTH 4 DAYS  Final   Report Status PENDING  Incomplete  Blood culture (routine x 2)     Status: None (Preliminary result)   Collection Time: 07/25/16  7:01 PM  Result Value Ref Range Status   Specimen  Description BLOOD LEFT ASSIST CONTROL  Final   Special Requests BOTTLES DRAWN AEROBIC AND ANAEROBIC 7CCAERO,5CCANA  Final   Culture NO GROWTH 4 DAYS  Final   Report Status PENDING  Incomplete  Urine culture     Status: Abnormal   Collection Time: 07/25/16  7:10 PM  Result Value Ref Range Status   Specimen Description URINE, RANDOM  Final   Special Requests NONE  Final   Culture MULTIPLE SPECIES PRESENT, SUGGEST RECOLLECTION (A)  Final   Report Status 07/27/2016 FINAL  Final  Blood culture (single)     Status: None (Preliminary result)   Collection Time: 07/25/16  8:34 PM  Result Value Ref Range Status   Specimen Description BLOOD LEFT FOREARM  Final   Special Requests BOTTLES DRAWN AEROBIC AND ANAEROBIC 9CCAERO,8CCANA  Final   Culture NO GROWTH 4 DAYS  Final   Report Status PENDING  Incomplete  MRSA PCR Screening     Status: None   Collection Time: 07/26/16 10:31 PM  Result Value Ref Range Status   MRSA by PCR NEGATIVE NEGATIVE Final    Comment:        The GeneXpert MRSA Assay (FDA approved for NASAL specimens only), is one component of a comprehensive MRSA colonization surveillance program. It is not intended to diagnose MRSA infection nor to guide or monitor treatment for MRSA infections.     Studies/Results: Ct Head W & Wo Contrast  Result Date: 07/29/2016 CLINICAL DATA:  Altered mental status.  Hallucinations. EXAM: CT HEAD WITHOUT AND WITH CONTRAST TECHNIQUE: Contiguous axial images were obtained from the base of the skull through the vertex without and with intravenous contrast CONTRAST:  75 mL Isovue-300 COMPARISON:  Noncontrast head CT 07/25/2016 FINDINGS: Brain: A chronic right PCA territory infarct is again seen involving the right occipital lobe. Well-defined hypodensity in the right thalamus is unchanged and also consistent with a chronic posterior circulation infarct. A chronic superior left cerebellar infarct is again noted. The ventricles are normal in size aside  from ex vacuo dilatation of the atrium and occipital horn on the right related to the chronic infarct. There is no evidence of acute cortical infarct, intracranial hemorrhage, mass, midline shift, or extra-axial fluid collection. No abnormal enhancement is identified. Vascular: No hyperdense vessel or unexpected calcification. Skull: No acute osseous abnormality. Sinuses/Orbits: Visualized paranasal sinuses and mastoid air cells are clear. Visualized orbits are unremarkable. IMPRESSION: 1. 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 2. No evidence of acute intracranial abnormality. No abnormal enhancement. Electronically Signed   By: Sebastian AcheAllen  Grady M.D.   On: 07/29/2016 14:26  Assessment/Plan: Rockney GheeJanice Winner is a 45 y.o. 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 She has no fevers, no leukocytosis. TTE is neg for veg, TEE pending.  She has encephalomalacia on admission CT, and chronic changes [id number] CVA or embolic phenomenon.   She reports being compliant with coumadin and fluconazole except when she was vomiting last week for 2 days. BCX neg on admit TEE negative  Recommendations Continue fluconazole - can change to po once stable 400 mg once a day For outpatient [facility] or I can see in follow up. Thank you very much for the consult. Will follow with you.  Dolph Tavano P   07/29/2016, 2:55 PM

## 2016-07-29 NOTE — CV Procedure (Signed)
    TRANSESOPHAGEAL ECHOCARDIOGRAM   NAME:  Rachel Morrison   MRN: [id number] DOB:  04/07/1971   ADMIT DATE: 07/25/2016  INDICATIONS:   PROCEDURE:   Informed consent was obtained prior to the procedure. The risks, benefits and alternatives for the procedure were discussed and the patient comprehended these risks.  Risks include, but are not limited to, cough, sore throat, vomiting, nausea, somnolence, esophageal and stomach trauma or perforation, bleeding, low blood pressure, aspiration, pneumonia, infection, trauma to the teeth and death.    After a procedural time-out, the patient was given 3 mg versed and 75 mcg fentanyl for moderate sedation.  The oropharynx was anesthetized 1 cc of topical 1% viscous lidocaine.  The transesophageal probe was inserted in the esophagus and stomach without difficulty and multiple views were obtained.    COMPLICATIONS:    There were no immediate complications.  FINDINGS:  LEFT VENTRICLE: EF = 55%. No regional wall motion abnormalities.  RIGHT VENTRICLE: Normal size and function.   LEFT ATRIUM: Mildly dilated. No thrombus  LEFT ATRIAL APPENDAGE: No thrombus.   RIGHT ATRIUM: normal   AORTIC VALVE:  Trileaflet. No evidence of  Vegetation or thrombus  MITRAL VALVE:    Bileaflet mechanical valve. Good motility. No vegetation or thrombus noted. Trivial mr  TRICUSPID VALVE: Normal.  PULMONIC VALVE: Grossly normal.  INTERATRIAL SEPTUM: No PFO or ASD.  PERICARDIUM: No effusion  DESCENDING AORTA:   CONCLUSION:  Mitral valve prosthesis is functioning normally. No vegetation or thrombus. LV function normal. Intra atrial septum intact. AOrtic valve trileaflet. No vegeation.

## 2016-07-29 NOTE — Progress Notes (Signed)
*  PRELIMINARY RESULTS* Echocardiogram Echocardiogram Transesophageal has been performed.  Cristela BlueHege, Damaya Channing 07/29/2016, 9:02 AM

## 2016-07-29 NOTE — Progress Notes (Signed)
PT Cancellation Note  Patient Details Name: Rachel GheeJanice Montejano MRN: [id number] DOB: 12-18-1970   Cancelled Treatment:    Reason Eval/Treat Not Completed: Other (comment). Pt currently out of room for TEE at this time, unavailable for therapy. Will re-attempt, time permitting.   Deztiny Sarra 07/29/2016, 8:59 AM Elizabeth PalauStephanie Aaronmichael Brumbaugh, PT, DPT [phone]

## 2016-07-29 NOTE — Progress Notes (Signed)
ANTICOAGULATION CONSULT NOTE - Initial Consult  Pharmacy Consult for heparin drip Indication: mechanical mitral valve  No Known Allergies  Patient Measurements: Height: 5\' 4"  (162.6 cm) Weight: 116 lb 13.5 oz (53 kg) IBW/kg (Calculated) : 54.7 Heparin Dosing Weight: 52 kg  Vital Signs: Temp: 98.3 F (36.8 C) (08/15 0505) Temp Source: Oral (08/15 0505) BP: 151/98 (08/15 0505) Pulse Rate: 82 (08/15 0505)  Labs:  Recent Labs  07/27/16 0623  07/27/16 2111 07/28/16 0303 07/29/16 0426  HGB 9.8*  --   --  9.6* 10.6*  HCT 29.6*  --   --  28.6* 31.9*  PLT 271  --   --  279 305  LABPROT  --   --   --   --  15.5*  INR  --   --   --   --  1.22  HEPARINUNFRC 1.05*  < > 0.62 0.53 0.42  CREATININE 0.83  --   --  0.87 0.79  < > = values in this interval not displayed.  Estimated Creatinine Clearance: 73.5 mL/min (by C-G formula based on SCr of 0.8 mg/dL).   Medical History: Past Medical History:  Diagnosis Date  . Anxiety   . H/O endocarditis   . H/O mitral valve replacement   . HLD (hyperlipidemia)     Medications:  On warfarin as outpatient.   Assessment: INR subtherapeutic on admission.   Goal of Therapy:  Heparin level 0.3-0.7 units/ml Monitor platelets by anticoagulation protocol: Yes   Plan:  Heparin level= 0.62. Will continue heparin infusion at 850 units/hr and order confirmatory  HL in 6 hours.   8/14 03:00 heparin level 0.53. Continue current regimen. Recheck heparin level and CBC with tomorrow AM labs.  08/15 0426 heparin level therapeutic. Continue current rate. Will recheck HL and CBC with AM labs.   Carola FrostNathan A Keyontae Huckeby, Pharm.D., BCPS Clinical Pharmacist 07/29/2016,5:41 AM

## 2016-07-29 NOTE — Progress Notes (Signed)
A&O. Up with standby assist. Foley in place. IV heparin drip at 8.5.  Medicated for pain during the night. For TEE this AM.

## 2016-07-29 NOTE — Consult Note (Signed)
  Psychiatry: Brief follow-up for this woman who presented this past weekend with agitated delirium. Patient seen. She is calm and alert and interactive. Alert and oriented. Good understanding of her acute medical problem. Patient is well groomed and has been cooperative with treatment. This is 2 days at least without any sign of delirium. Whatever the presenting reason for it is seems to have resolved for now. I'm going to sign off unless new needs arise in which case please call me.

## 2016-07-30 ENCOUNTER — Encounter: Payer: Self-pay | Admitting: Cardiology

## 2016-07-30 LAB — CULTURE, BLOOD (ROUTINE X 2)
Culture: NO GROWTH
Culture: NO GROWTH

## 2016-07-30 LAB — CULTURE, BLOOD (SINGLE): CULTURE: NO GROWTH

## 2016-07-30 LAB — PROTIME-INR
INR: 1.54
Prothrombin Time: 18.6 seconds — ABNORMAL HIGH (ref 11.4–15.2)

## 2016-07-30 LAB — CBC
HEMATOCRIT: 32.3 % — AB (ref 35.0–47.0)
HEMOGLOBIN: 10.6 g/dL — AB (ref 12.0–16.0)
MCH: 25.9 pg — AB (ref 26.0–34.0)
MCHC: 32.8 g/dL (ref 32.0–36.0)
MCV: 79.1 fL — AB (ref 80.0–100.0)
PLATELETS: 328 10*3/uL (ref 150–440)
RBC: 4.09 MIL/uL (ref 3.80–5.20)
RDW: 19.6 % — ABNORMAL HIGH (ref 11.5–14.5)
WBC: 9.1 10*3/uL (ref 3.6–11.0)

## 2016-07-30 LAB — HEPARIN LEVEL (UNFRACTIONATED): Heparin Unfractionated: 0.42 IU/mL (ref 0.30–0.70)

## 2016-07-30 MED ORDER — WARFARIN SODIUM 1 MG PO TABS
6.0000 mg | ORAL_TABLET | Freq: Once | ORAL | Status: AC
  Administered 2016-07-30: 6 mg via ORAL
  Filled 2016-07-30: qty 1

## 2016-07-30 NOTE — Progress Notes (Signed)
Patient has been alert and oriented. Vitals stable. Complaining of chronic back pain, given PRN pain meds with some relief. Per patient MD told her she may discharge tomorrow. Still currently on heparin at 8.5. INR is up to 1.54.

## 2016-07-30 NOTE — Progress Notes (Signed)
Subjective: Patient alert.  Appears at baseline.  No further hallucinations reported.    Objective: Current vital signs: BP (!) 146/94 (BP Location: Left Arm)   Pulse 84   Temp 98.2 F (36.8 C) (Oral)   Resp 18   Ht 5\' 4"  (1.626 m)   Wt 54.2 kg (119 lb 6.4 oz)   LMP 07/09/2016 (Approximate)   SpO2 100%   BMI 20.49 kg/m  Vital signs in last 24 hours: Temp:  [97.8 F (36.6 C)-98.3 F (36.8 C)] 98.2 F (36.8 C) (08/16 1115) Pulse Rate:  [83-89] 84 (08/16 1115) Resp:  [16-22] 18 (08/16 1115) BP: (146-150)/(86-94) 146/94 (08/16 1115) SpO2:  [100 %] 100 % (08/16 1115)  Intake/Output from previous day: 08/15 0701 - 08/16 0700 In: 1446.2 [P.O.:600; I.V.:196.2] Out: 2200 [Urine:2200] Intake/Output this shift: Total I/O In: 240 [P.O.:240] Out: 0  Nutritional status: Diet regular Room service appropriate? Yes; Fluid consistency: Thin  Neurologic Exam: Mental Status: Alert, oriented, thought content appropriate.  Speech fluent without evidence of aphasia.  Able to follow 3 step commands without difficulty. Cranial Nerves: II: Discs flat bilaterally; Visual fields grossly normal, pupils equal, round, reactive to light and accommodation III,IV, VI: ptosis not present, extra-ocular motions intact bilaterally V,VII: smile symmetric, facial light touch sensation normal bilaterally VIII: hearing normal bilaterally IX,X: gag reflex present XI: bilateral shoulder shrug XII: midline tongue extension Motor: 5/5 throughout   Lab Results: Basic Metabolic Panel:  Recent Labs Lab 07/25/16 1820 07/26/16 0037 07/27/16 0623 07/28/16 0303 07/29/16 0426  NA 136 136 139 138 138  K 3.8 3.2* 3.0* 3.0* 4.5  CL 103 103 109 109 111  CO2 23 24 25 25 22   GLUCOSE 92 132* 131* 113* 90  BUN 19 17 8 10 10   CREATININE 0.84 0.86 0.83 0.87 0.79  CALCIUM 9.6 8.8* 8.5* 8.2* 8.7*  MG  --   --   --  2.0  --     Liver Function Tests:  Recent Labs Lab 07/25/16 1820  AST 52*  ALT 28   ALKPHOS 109  BILITOT 0.5  PROT 8.2*  ALBUMIN 4.2    Recent Labs Lab 07/25/16 1820  LIPASE 65*    Recent Labs Lab 07/25/16 1820  AMMONIA 13    CBC:  Recent Labs Lab 07/25/16 1804 07/26/16 0037 07/27/16 0623 07/28/16 0303 07/29/16 0426 07/30/16 0712  WBC 7.5 6.2 7.3 6.5 6.9 9.1  NEUTROABS 3.9  --   --   --   --   --   HGB 10.6* 10.1* 9.8* 9.6* 10.6* 10.6*  HCT 32.3* 29.9* 29.6* 28.6* 31.9* 32.3*  MCV 78.9* 77.8* 79.4* 78.2* 79.3* 79.1*  PLT 325 300 271 279 305 328    Cardiac Enzymes:  Recent Labs Lab 07/25/16 1804  TROPONINI 0.06*    Lipid Panel: No results for input(s): CHOL, TRIG, HDL, CHOLHDL, VLDL, LDLCALC in the last 168 hours.  CBG:  Recent Labs Lab 07/26/16 2222  GLUCAP 107*    Microbiology: Results for orders placed or performed during the hospital encounter of 07/25/16  Blood culture (routine x 2)     Status: None (Preliminary result)   Collection Time: 07/25/16  7:01 PM  Result Value Ref Range Status   Specimen Description BLOOD LEFT ARM  Final   Special Requests BOTTLES DRAWN AEROBIC AND ANAEROBIC 7CCAERO,7CCANA  Final   Culture NO GROWTH 4 DAYS  Final   Report Status PENDING  Incomplete  Blood culture (routine x 2)     Status:  None (Preliminary result)   Collection Time: 07/25/16  7:01 PM  Result Value Ref Range Status   Specimen Description BLOOD LEFT ASSIST CONTROL  Final   Special Requests BOTTLES DRAWN AEROBIC AND ANAEROBIC 7CCAERO,5CCANA  Final   Culture NO GROWTH 4 DAYS  Final   Report Status PENDING  Incomplete  Urine culture     Status: Abnormal   Collection Time: 07/25/16  7:10 PM  Result Value Ref Range Status   Specimen Description URINE, RANDOM  Final   Special Requests NONE  Final   Culture MULTIPLE SPECIES PRESENT, SUGGEST RECOLLECTION (A)  Final   Report Status 07/27/2016 FINAL  Final  Blood culture (single)     Status: None (Preliminary result)   Collection Time: 07/25/16  8:34 PM  Result Value Ref Range  Status   Specimen Description BLOOD LEFT FOREARM  Final   Special Requests BOTTLES DRAWN AEROBIC AND ANAEROBIC 9CCAERO,8CCANA  Final   Culture NO GROWTH 4 DAYS  Final   Report Status PENDING  Incomplete  MRSA PCR Screening     Status: None   Collection Time: 07/26/16 10:31 PM  Result Value Ref Range Status   MRSA by PCR NEGATIVE NEGATIVE Final    Comment:        The GeneXpert MRSA Assay (FDA approved for NASAL specimens only), is one component of a comprehensive MRSA colonization surveillance program. It is not intended to diagnose MRSA infection nor to guide or monitor treatment for MRSA infections.     Coagulation Studies:  Recent Labs  07/29/16 0426 07/30/16 0712  LABPROT 15.5* 18.6*  INR 1.22 1.54    Imaging: Ct Head W & Wo Contrast  Result Date: 07/29/2016 CLINICAL DATA:  Altered mental status.  Hallucinations. EXAM: CT HEAD WITHOUT AND WITH CONTRAST TECHNIQUE: Contiguous axial images were obtained from the base of the skull through the vertex without and with intravenous contrast CONTRAST:  75 mL Isovue-300 COMPARISON:  Noncontrast head CT 07/25/2016 FINDINGS: Brain: A chronic right PCA territory infarct is again seen involving the right occipital lobe. Well-defined hypodensity in the right thalamus is unchanged and also consistent with a chronic posterior circulation infarct. A chronic superior left cerebellar infarct is again noted. The ventricles are normal in size aside from ex vacuo dilatation of the atrium and occipital horn on the right related to the chronic infarct. There is no evidence of acute cortical infarct, intracranial hemorrhage, mass, midline shift, or extra-axial fluid collection. No abnormal enhancement is identified. Vascular: No hyperdense vessel or unexpected calcification. Skull: No acute osseous abnormality. Sinuses/Orbits: Visualized paranasal sinuses and mastoid air cells are clear. Visualized orbits are unremarkable. IMPRESSION: 1. 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 2. No evidence of acute intracranial abnormality. No abnormal enhancement. Electronically Signed   By: Sebastian Ache M.D.   On: 07/29/2016 14:26    Medications:  I have reviewed the patient's current medications. Scheduled: . DULoxetine  30 mg Oral Daily  . fluconazole (DIFLUCAN) IV  400 mg Intravenous Q24H  . folic acid  1 mg Oral Daily  . multivitamin with minerals  1 tablet Oral Daily  . nicotine  21 mg Transdermal Daily  . sodium chloride flush  3 mL Intravenous Q12H  . sodium chloride flush  3 mL Intravenous Q12H  . thiamine  100 mg Oral Daily   Or  . thiamine  100 mg Intravenous Daily  . Warfarin - Physician Dosing Inpatient   Does not  apply q1800    Assessment/Plan: Patient improved.  TEE was unremarkable.  Repeat head CT personally reviewed and showed no acute changes and no enhancing lesion.  INR remains subtherapeutic.  Recommendations: 1.  Agree with current management   LOS: 5 days   Thana FarrLeslie Mimie Goering, MD Neurology [phone] 07/30/2016  11:23 AM

## 2016-07-30 NOTE — Progress Notes (Signed)
ANTICOAGULATION CONSULT NOTE - Follow up Consult  Pharmacy Consult for heparin drip Indication: mechanical mitral valve  No Known Allergies  Patient Measurements: Height: 5\' 4"  (162.6 cm) Weight: 119 lb 6.4 oz (54.2 kg) IBW/kg (Calculated) : 54.7 Heparin Dosing Weight: 54.2 kg  Vital Signs: Temp: 98.3 F (36.8 C) (08/16 0536) Temp Source: Oral (08/16 0536) BP: 150/94 (08/16 0536) Pulse Rate: 85 (08/16 0536)  Labs:  Recent Labs  07/28/16 0303 07/29/16 0426 07/30/16 0712  HGB 9.6* 10.6* 10.6*  HCT 28.6* 31.9* 32.3*  PLT 279 305 328  LABPROT  --  15.5* 18.6*  INR  --  1.22 1.54  HEPARINUNFRC 0.53 0.42 0.42  CREATININE 0.87 0.79  --     Estimated Creatinine Clearance: 75.2 mL/min (by C-G formula based on SCr of 0.8 mg/dL).   Medical History: Past Medical History:  Diagnosis Date  . Anxiety   . H/O endocarditis   . H/O mitral valve replacement   . HLD (hyperlipidemia)     Medications:  On warfarin as outpatient.   Assessment: INR subtherapeutic on admission.   Goal of Therapy:  Heparin level 0.3-0.7 units/ml Monitor platelets by anticoagulation protocol: Yes   Plan:  Heparin level= 0.42. Remains therapeutic.  Continue current rate of 8.5 ml/hr (rate confirmed with RN). Will recheck HL and CBC with AM labs. Hgb and Plt count stable from yesterday.   Pharmacy will continue to follow.   Marty HeckWang, Amaziah Ghosh L, Pharm.D., BCPS Clinical Pharmacist 07/30/2016,8:41 AM

## 2016-07-30 NOTE — Progress Notes (Signed)
PT Cancellation Note  Patient Details Name: Rachel GheeJanice Morrison MRN: [id number] DOB: 08/15/1971   Cancelled Treatment:    Reason Eval/Treat Not Completed: Fatigue/lethargy limiting ability to participate.  States she has not slept at all and wants to wait.  Will try later as time and pt allow.   Ivar DrapeStout, Fayetta Sorenson E 07/30/2016, 1:53 PM    Samul Dadauth Sindy Mccune, PT MS Acute Rehab Dept. Number: [facility] [phone] and [facility] [phone]

## 2016-07-30 NOTE — Progress Notes (Signed)
Pharmacy Antibiotic Note  Rachel Morrison is a 45 y.o. 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  Pharmacy has been consulted for fluconazole dosing. Vancomycin and Zosyn d/c'd 8/14.  Plan: Continue Fluconazole 400 mg daily as continuation.   Height: 5\' 4"  (162.6 cm) Weight: 119 lb 6.4 oz (54.2 kg) IBW/kg (Calculated) : 54.7  Temp (24hrs), Avg:98.1 F (36.7 C), Min:97.8 F (36.6 C), Max:98.3 F (36.8 C)   Recent Labs Lab 07/25/16 1820 07/25/16 2200 07/26/16 0037 07/27/16 [id number] 07/28/16 0042 07/28/16 0303 07/29/16 0426 07/30/16 0712  WBC  --   --  6.2 7.3  --  6.5 6.9 9.1  CREATININE 0.84  --  0.86 0.83  --  0.87 0.79  --   LATICACIDVEN 2.0* 1.9  --   --   --   --   --   --   VANCOTROUGH  --   --   --   --  18  --   --   --     Estimated Creatinine Clearance: 75.2 mL/min (by C-G formula based on SCr of 0.8 mg/dL).    No Known Allergies  Antimicrobials this admission: vancomycin  8/12 >> 8/14 Zosyn 8/12 >> 8/14 Fluconazole 8/12 >>  Dose adjustments this admission:  8/14 00:30 vanc level 18. Continue current regimen   Microbiology results: 8/12 MRSA PCR: negative 8/11 BCx: NGTD 8/11 UCx: mx species    Thank you for allowing pharmacy to be a part of this patient's care.  Rachel Morrison, Rachel Morrison L 07/30/2016 9:01 AM

## 2016-07-30 NOTE — Progress Notes (Signed)
A&O. Foley removed per patient request. Voiding well. No s/s distress. Heparin infusing. Medicated for pain during the night.

## 2016-07-31 LAB — CBC
HCT: 30.8 % — ABNORMAL LOW (ref 35.0–47.0)
Hemoglobin: 10.3 g/dL — ABNORMAL LOW (ref 12.0–16.0)
MCH: 26.5 pg (ref 26.0–34.0)
MCHC: 33.6 g/dL (ref 32.0–36.0)
MCV: 79 fL — AB (ref 80.0–100.0)
PLATELETS: 308 10*3/uL (ref 150–440)
RBC: 3.9 MIL/uL (ref 3.80–5.20)
RDW: 19.4 % — AB (ref 11.5–14.5)
WBC: 7.4 10*3/uL (ref 3.6–11.0)

## 2016-07-31 LAB — PROTIME-INR
INR: 1.82
Prothrombin Time: 21.3 seconds — ABNORMAL HIGH (ref 11.4–15.2)

## 2016-07-31 LAB — HEPARIN LEVEL (UNFRACTIONATED): HEPARIN UNFRACTIONATED: 0.42 [IU]/mL (ref 0.30–0.70)

## 2016-07-31 MED ORDER — WARFARIN SODIUM 6 MG PO TABS
6.0000 mg | ORAL_TABLET | Freq: Once | ORAL | Status: AC
  Administered 2016-07-31: 6 mg via ORAL
  Filled 2016-07-31: qty 1

## 2016-07-31 NOTE — Progress Notes (Signed)
ANTICOAGULATION CONSULT NOTE - Follow up Consult  Pharmacy Consult for heparin drip Indication: mechanical mitral valve  No Known Allergies  Patient Measurements: Height: 5\' 4"  (162.6 cm) Weight: 119 lb 6.4 oz (54.2 kg) IBW/kg (Calculated) : 54.7 Heparin Dosing Weight: 54.2 kg  Vital Signs: Temp: 98.3 F (36.8 C) (08/17 0412) Temp Source: Oral (08/17 0412) BP: 148/102 (08/17 0412) Pulse Rate: 90 (08/17 0412)  Labs:  Recent Labs  07/29/16 0426 07/30/16 0712 07/31/16 0456  HGB 10.6* 10.6* 10.3*  HCT 31.9* 32.3* 30.8*  PLT 305 328 308  LABPROT 15.5* 18.6* 21.3*  INR 1.22 1.54 1.82  HEPARINUNFRC 0.42 0.42 0.42  CREATININE 0.79  --   --     Estimated Creatinine Clearance: 75.2 mL/min (by C-G formula based on SCr of 0.8 mg/dL).   Medical History: Past Medical History:  Diagnosis Date  . Anxiety   . H/O endocarditis   . H/O mitral valve replacement   . HLD (hyperlipidemia)     Medications:  On warfarin as outpatient.   Assessment: INR subtherapeutic on admission.   Goal of Therapy:  Heparin level 0.3-0.7 units/ml Monitor platelets by anticoagulation protocol: Yes   Plan:  Heparin level= 0.42. Remains therapeutic.  Continue current rate of 8.5 ml/hr (rate confirmed with RN). Will recheck HL and CBC with AM labs. Hgb and Plt count stable from yesterday.   Pharmacy will continue to follow.   Carola FrostNathan A Joanathan Affeldt, Pharm.D., BCPS Clinical Pharmacist 07/31/2016,6:15 AM

## 2016-07-31 NOTE — Progress Notes (Signed)
Physical Therapy Treatment Patient Details Name: Rachel Morrison MRN: [id number] DOB: 1970-12-27 Today's Date: 07/31/2016    History of Present Illness 45 y.o. female who presents with Altered mental status and hallucinations: people, formations, etc. Patient states that she has had some other hallucinations recently. Sometimes she can tolerate hallucinations and other times not. 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 She's had difficult time maintaining therapeutic INR with several episodes of subtherapeutic INR. She denies any recent infectious symptoms. 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    PT Comments    Pt demonstrates continued improvement in mobility since last physical therapy treatment session. She is able to complete a full lap around RN station without an assistive device and minimal lateral deviation with head turns. BERG of 52/56 indicating higher level balance dysfunction. Five time sit to stand of 15.35 seconds which is above cut-off for age/gender norms indicating decreased LE strength and increased fall risk. Pt would benefit from OP PT after discharge for strength and balance training due to multiple recent hospitalizations. Pt will benefit from skilled PT services to address deficits in strength, balance, and mobility in order to return to full function at home.   Follow Up Recommendations  Outpatient PT     Equipment Recommendations  Cane (Use cane prn)    Recommendations for Other Services       Precautions / Restrictions Precautions Precautions: Fall Restrictions Weight Bearing Restrictions: No    Mobility  Bed Mobility Overal bed mobility: Modified Independent              General bed mobility comments: Good speed/sequencing  Transfers Overall transfer level: Modified independent Equipment used: None             General transfer comment: Good speed, sequencing, and stability  Ambulation/Gait Ambulation/Gait assistance: Min guard Ambulation Distance (Feet): 220 Feet Assistive device: None (Intermittently holds onto IV pole) Gait Pattern/deviations: Decreased step length - right;Decreased step length - left Gait velocity: Decreased but functional for limited community ambulation Gait velocity interpretation: Below normal speed for age/gender General Gait Details: Pt demonstrates fair stability with ambulation but clearly lacking confidence and intermittently reaching for IV pole. Vitals remain WNL throughout ambulation and pt denies DOE. Pt able to perform horizontal head turns with mild lateral gait deviation. No deviation with vertical head turns or gait speed changes. Able to perform 180 degree turns in <3 seconds without stepping or stumbling   Stairs            Wheelchair Mobility    Modified Rankin (Stroke Patients Only)       Balance Overall balance assessment: Needs assistance Sitting-balance support: No upper extremity supported Sitting balance-Leahy Scale: Normal     Standing balance support: No upper extremity supported Standing balance-Leahy Scale: Good                 High Level Balance Comments: 5TSTS: 15.35 seconds. See BERG for more details regarding balance    Cognition Arousal/Alertness: Awake/alert Behavior During Therapy: WFL for tasks assessed/performed Overall Cognitive Status: Within Functional Limits for tasks assessed                      Exercises Other Exercises Other  Exercises: Completed BERG and 5TSTS with patient    General Comments        Pertinent Vitals/Pain Pain Assessment: No/denies pain    Home Living                      Prior Function            PT  Goals (current goals can now be found in the care plan section) Acute Rehab PT Goals Patient Stated Goal: To return home PT Goal Formulation: With patient Time For Goal Achievement: 08/09/16 Potential to Achieve Goals: Good Progress towards PT goals: Progressing toward goals    Frequency  Min 2X/week    PT Plan Current plan remains appropriate    Co-evaluation             End of Session Equipment Utilized During Treatment: Gait belt Activity Tolerance: Patient tolerated treatment well Patient left: in bed;with call bell/phone within reach;with bed alarm set     Time: [id number] PT Time Calculation (min) (ACUTE ONLY): 29 min  Charges:  $Gait Training: 8-22 mins $Neuromuscular Re-education: 8-22 mins                    G Codes:      Sharalyn InkJason D Huprich PT, DPT   Huprich,Jason 07/31/2016, 2:16 PM

## 2016-07-31 NOTE — Progress Notes (Signed)
[facility] - [place] at [facility]                                                                                                                                                                                            Patient Demographics   Rachel Morrison, is a 45 y.o. female, DOB - [birth year], ZOX:[id number]  Admit date - 07/25/2016   Admitting Physician Oralia Manis, MD  Outpatient Primary MD for the patient is No PCP Per Patient   LOS - 6  Subjective:INR is trending up at not therapeutic she denies any complaints except chronic back pain  Review of Systems:   Review of Systems  Constitutional: Negative for chills, diaphoresis, fever, malaise/fatigue and weight loss.  HENT: Negative for congestion, ear discharge, ear pain, hearing loss, nosebleeds and tinnitus.   Eyes: Negative.  Negative for blurred vision, double vision, photophobia, pain and discharge.  Respiratory: Negative for cough, hemoptysis, sputum production and shortness of breath.   Cardiovascular: Negative.  Negative for chest pain, palpitations, orthopnea and claudication.  Gastrointestinal: Negative for abdominal pain, constipation, diarrhea, heartburn, nausea and vomiting.  Genitourinary: Negative for dysuria, frequency, hematuria and urgency.  Musculoskeletal: Positive for back pain.  Skin: Negative.  Negative for itching and rash.  Neurological: Negative for dizziness, tingling, tremors, sensory change, weakness and headaches.  Endo/Heme/Allergies: Negative for polydipsia.  Psychiatric/Behavioral: The patient is not nervous/anxious.     Vitals:   Vitals:   07/31/16 0412 07/31/16 0532 07/31/16 0725 07/31/16 1104  BP: (!) 148/102 (!) 146/95 (!) 148/95 (!) 157/92  Pulse: 90  85 92  Resp: 16  18 20   Temp: 98.3 F (36.8 C)   98.4 F (36.9 C)  TempSrc: Oral   Oral  SpO2: 99%  100% 99%  Weight:      Height:        Wt Readings from Last 3 Encounters:  07/29/16 54.2 kg (119 lb 6.4  oz)     Intake/Output Summary (Last 24 hours) at 07/31/16 1240 Last data filed at 07/31/16 1123  Gross per 24 hour  Intake            435.5 ml  Output             1002 ml  Net           -566.5 ml    Physical Exam:   GENERAL: Sedated HEAD, EYES, EARS, NOSE AND THROAT: Atraumatic, normocephalic. Extraocular muscles are intact. Pupils equal and reactive to light. Sclerae anicteric. No conjunctival injection. No oro-pharyngeal erythema.  NECK: Supple. There is no jugular venous distention. No bruits, no lymphadenopathy,  no thyromegaly.  HEART: Regular rate and rhythm,. No murmurs, no rubs, no clicks.  LUNGS: Clear to auscultation bilaterally. No rales or rhonchi. No wheezes.  ABDOMEN: Soft, flat, nontender, nondistended. Has good bowel sounds. No hepatosplenomegaly appreciated.  EXTREMITIES: No evidence of any cyanosis, clubbing, or peripheral edema.  +2 pedal and radial pulses bilaterally.  NEUROLOGIC: The patient is sedated  SKIN: Moist and warm with no rashes appreciated.  Psych: Sedated LN: No inguinal LN enlargement    Antibiotics   Anti-infectives    Start     Dose/Rate Route Frequency Ordered Stop   07/27/16 1000  fluconazole (DIFLUCAN) IVPB 400 mg     400 mg 100 mL/hr over 120 Minutes Intravenous Every 24 hours 07/26/16 0039     07/26/16 1300  vancomycin (VANCOCIN) IVPB 750 mg/150 ml premix  Status:  Discontinued     750 mg 150 mL/hr over 60 Minutes Intravenous Every 12 hours 07/26/16 0258 07/28/16 1639   07/26/16 0200  piperacillin-tazobactam (ZOSYN) IVPB 4.5 g  Status:  Discontinued     4.5 g 25 mL/hr over 240 Minutes Intravenous Every 8 hours 07/26/16 0103 07/28/16 1639   07/25/16 2245  piperacillin-tazobactam (ZOSYN) IVPB 4.5 g  Status:  Discontinued     4.5 g 200 mL/hr over 30 Minutes Intravenous Every 8 hours 07/25/16 2232 07/26/16 0103   07/25/16 2230  fluconazole (DIFLUCAN) IVPB 800 mg     800 mg 200 mL/hr over 120 Minutes Intravenous  Once 07/25/16 2229  07/26/16 0221   07/25/16 2230  vancomycin (VANCOCIN) IVPB 1000 mg/200 mL premix     1,000 mg 200 mL/hr over 60 Minutes Intravenous  Once 07/25/16 2229 07/26/16 0300      Medications   Scheduled Meds: . DULoxetine  30 mg Oral Daily  . fluconazole (DIFLUCAN) IV  400 mg Intravenous Q24H  . folic acid  1 mg Oral Daily  . multivitamin with minerals  1 tablet Oral Daily  . nicotine  21 mg Transdermal Daily  . sodium chloride flush  3 mL Intravenous Q12H  . sodium chloride flush  3 mL Intravenous Q12H  . thiamine  100 mg Oral Daily   Or  . thiamine  100 mg Intravenous Daily  . warfarin  6 mg Oral ONCE-1800  . Warfarin - Physician Dosing Inpatient   Does not apply q1800   Continuous Infusions: . heparin 850 Units/hr (07/30/16 1837)   PRN Meds:.acetaminophen **OR** acetaminophen, haloperidol lactate, LORazepam, morphine injection, ondansetron **OR** ondansetron (ZOFRAN) IV, oxyCODONE-acetaminophen, sodium chloride flush   Data Review:   Micro Results Recent Results (from the past 240 hour(s))  Blood culture (routine x 2)     Status: None   Collection Time: 07/25/16  7:01 PM  Result Value Ref Range Status   Specimen Description BLOOD LEFT ARM  Final   Special Requests BOTTLES DRAWN AEROBIC AND ANAEROBIC 7CCAERO,7CCANA  Final   Culture NO GROWTH 5 DAYS  Final   Report Status 07/30/2016 FINAL  Final  Blood culture (routine x 2)     Status: None   Collection Time: 07/25/16  7:01 PM  Result Value Ref Range Status   Specimen Description BLOOD LEFT ASSIST CONTROL  Final   Special Requests BOTTLES DRAWN AEROBIC AND ANAEROBIC 7CCAERO,5CCANA  Final   Culture NO GROWTH 5 DAYS  Final   Report Status 07/30/2016 FINAL  Final  Urine culture     Status: Abnormal   Collection Time: 07/25/16  7:10 PM  Result Value Ref Range Status  Specimen Description URINE, RANDOM  Final   Special Requests NONE  Final   Culture MULTIPLE SPECIES PRESENT, SUGGEST RECOLLECTION (A)  Final   Report Status  07/27/2016 FINAL  Final  Blood culture (single)     Status: None   Collection Time: 07/25/16  8:34 PM  Result Value Ref Range Status   Specimen Description BLOOD LEFT FOREARM  Final   Special Requests BOTTLES DRAWN AEROBIC AND ANAEROBIC 9CCAERO,8CCANA  Final   Culture NO GROWTH 5 DAYS  Final   Report Status 07/30/2016 FINAL  Final  MRSA PCR Screening     Status: None   Collection Time: 07/26/16 10:31 PM  Result Value Ref Range Status   MRSA by PCR NEGATIVE NEGATIVE Final    Comment:        The GeneXpert MRSA Assay (FDA approved for NASAL specimens only), is one component of a comprehensive MRSA colonization surveillance program. It is not intended to diagnose MRSA infection nor to guide or monitor treatment for MRSA infections.     Radiology Reports Dg Chest 1 View  Result Date: 07/25/2016 CLINICAL DATA:  Altered mental status. Recent mitral valve replacement. EXAM: CHEST 1 VIEW COMPARISON:  None. FINDINGS: Sternotomy wires appear aligned and intact. Mitral valve prosthesis is in place. Normal heart size. Normal mediastinal contour. No pneumothorax. No pleural effusion. Lungs appear clear, with no acute consolidative airspace disease and no pulmonary edema. IMPRESSION: No active disease. Electronically Signed   By: Delbert PhenixJason A Poff M.D.   On: 07/25/2016 18:40   Ct Head Wo Contrast  Result Date: 07/25/2016 CLINICAL DATA:  Altered mental status. Anticoagulated on Coumadin for recent mitral valve replacement. Confusion and hallucinations. EXAM: CT HEAD WITHOUT CONTRAST TECHNIQUE: Contiguous axial images were obtained from the base of the skull through the vertex without intravenous contrast. COMPARISON:  None. FINDINGS: There is a large focus of encephalomalacia in the medial right occipital lobe with associated ex vacuo dilatation of the occipital horn of the right lateral ventricle. There is a small focus of encephalomalacia in the left superior cerebellar hemisphere. There is a hypoechoic  focus in the right thalamus. No evidence of parenchymal hemorrhage or extra-axial fluid collection. No mass lesion, mass effect, or midline shift. Otherwise no ventriculomegaly. The visualized paranasal sinuses are essentially clear. The mastoid air cells are unopacified. No evidence of calvarial fracture. IMPRESSION: 1. Large focus of encephalomalacia in the medial right occipital lobe with ex vacuo dilatation of the occipital horn of the right lateral ventricle, most suggestive of a chronic infarct. 2. Small focus of encephalomalacia in the left superior cerebellar hemisphere, most suggestive of a late subacute to chronic infarct. 3. Hypoechoic focus in the right thalamus, suggesting an infarct of uncertain chronicity, probably subacute to chronic. Correlate with brain MRI as clinically warranted. 4. No acute intracranial hemorrhage. No significant mass effect or midline shift. Electronically Signed   By: Delbert PhenixJason A Poff M.D.   On: 07/25/2016 18:50   Ct Head W & Wo Contrast  Result Date: 07/29/2016 CLINICAL DATA:  Altered mental status.  Hallucinations. EXAM: CT HEAD WITHOUT AND WITH CONTRAST TECHNIQUE: Contiguous axial images were obtained from the base of the skull through the vertex without and with intravenous contrast CONTRAST:  75 mL Isovue-300 COMPARISON:  Noncontrast head CT 07/25/2016 FINDINGS: Brain: A chronic right PCA territory infarct is again seen involving the right occipital lobe. Well-defined hypodensity in the right thalamus is unchanged and also consistent with a chronic posterior circulation infarct. A chronic superior left cerebellar infarct  is again noted. The ventricles are normal in size aside from ex vacuo dilatation of the atrium and occipital horn on the right related to the chronic infarct. There is no evidence of acute cortical infarct, intracranial hemorrhage, mass, midline shift, or extra-axial fluid collection. No abnormal enhancement is identified. Vascular: No hyperdense vessel  or unexpected calcification. Skull: No acute osseous abnormality. Sinuses/Orbits: Visualized paranasal sinuses and mastoid air cells are clear. Visualized orbits are unremarkable. IMPRESSION: 1. 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 2. No evidence of acute intracranial abnormality. No abnormal enhancement. Electronically Signed   By: Sebastian Ache M.D.   On: 07/29/2016 14:26     CBC  Recent Labs Lab 07/25/16 1804  07/27/16 0623 07/28/16 0303 07/29/16 0426 07/30/16 0712 07/31/16 0456  WBC 7.5  < > 7.3 6.5 6.9 9.1 7.4  HGB 10.6*  < > 9.8* 9.6* 10.6* 10.6* 10.3*  HCT 32.3*  < > 29.6* 28.6* 31.9* 32.3* 30.8*  PLT 325  < > 271 279 305 328 308  MCV 78.9*  < > 79.4* 78.2* 79.3* 79.1* 79.0*  MCH 25.8*  < > 26.1 26.4 26.2 25.9* 26.5  MCHC 32.7  < > 32.9 33.7 33.1 32.8 33.6  RDW 18.8*  < > 18.3* 18.6* 19.7* 19.6* 19.4*  LYMPHSABS 2.7  --   --   --   --   --   --   MONOABS 0.7  --   --   --   --   --   --   EOSABS 0.1  --   --   --   --   --   --   BASOSABS 0.0  --   --   --   --   --   --   < > = values in this interval not displayed.  Chemistries   Recent Labs Lab 07/25/16 1820 07/26/16 0037 07/27/16 0623 07/28/16 0303 07/29/16 0426  NA 136 136 139 138 138  K 3.8 3.2* 3.0* 3.0* 4.5  CL 103 103 109 109 111  CO2 23 24 25 25 22   GLUCOSE 92 132* 131* 113* 90  BUN 19 17 8 10 10   CREATININE 0.84 0.86 0.83 0.87 0.79  CALCIUM 9.6 8.8* 8.5* 8.2* 8.7*  MG  --   --   --  2.0  --   AST 52*  --   --   --   --   ALT 28  --   --   --   --   ALKPHOS 109  --   --   --   --   BILITOT 0.5  --   --   --   --    ------------------------------------------------------------------------------------------------------------------ estimated creatinine clearance is 75.2 mL/min (by C-G formula based on SCr of 0.8  mg/dL). ------------------------------------------------------------------------------------------------------------------ No results for input(s): HGBA1C in the last 72 hours. ------------------------------------------------------------------------------------------------------------------ No results for input(s): CHOL, HDL, LDLCALC, TRIG, CHOLHDL, LDLDIRECT in the last 72 hours. ------------------------------------------------------------------------------------------------------------------ No results for input(s): TSH, T4TOTAL, T3FREE, THYROIDAB in the last 72 hours.  Invalid input(s): FREET3 ------------------------------------------------------------------------------------------------------------------ No results for input(s): VITAMINB12, FOLATE, FERRITIN, TIBC, IRON, RETICCTPCT in the last 72 hours.  Coagulation profile  Recent Labs Lab 07/25/16 1804 07/29/16 0426 07/30/16 0712 07/31/16 0456  INR 1.82 1.22 1.54 1.82    No results for input(s): DDIMER in the last 72 hours.  Cardiac Enzymes  Recent Labs Lab 07/25/16 1804  TROPONINI 0.06*   ------------------------------------------------------------------------------------------------------------------ Invalid input(s):  POCBNP    Assessment & Plan  Patient is a 45 year old with history of endocarditis with mechanical mitral valve replacement  1. Acute encephalopathy and agitation Suspect related to possible benzo withdrawalNow resolved    2. . Mechanical Mitral value replacement Patient with multiple abnormalities noted on the CT scan suggestive of old infarcts Patient's INR is subtherapeutic We'll continue IV heparin Continue CoumadinWith a goal INR of 2.5-3.5 TEE without any thrombus  3. Hypokalemia replaced   4. Anxiety disorder with acute exasperation improved as above  5. History of endocarditis Appreciate ID input patient afebrile Blood cultures negative DC all antibiotics except antifungals  which she was on previously    All the records are reviewed and case discussed with ED provider.     Code Status Orders        Start     Ordered   07/25/16 2320  Full code  Continuous     07/25/16 2319    Code Status History    Date Active Date Inactive Code Status Order ID Comments User Context   This patient has a current code status but no historical code status.           Consults Neurology, infectious disease, psychiatry DVT Prophylaxis heparin drip  Lab Results  Component Value Date   PLT 308 07/31/2016     Time Spent in minutes  .   Auburn Bilberry M.D on 07/31/2016 at 12:40 PM  Between 7am to 6pm - Pager - [phone]  After 6pm go to [email or web address] - password [email or web address] [facility]  [facility]   Office  [phone]

## 2016-07-31 NOTE — Progress Notes (Signed)
A&O. Independent. Still on heparin drip. Medicated for pain.

## 2016-07-31 NOTE — Care Management (Signed)
Barrier to discharge: INR subtherapeutic , Heparin IV gtt, and coumadin. INR 1.82

## 2016-08-01 LAB — PROTIME-INR
INR: 2.86
PROTHROMBIN TIME: 30.6 s — AB (ref 11.4–15.2)

## 2016-08-01 LAB — CBC
HEMATOCRIT: 37.7 % (ref 35.0–47.0)
HEMOGLOBIN: 12.4 g/dL (ref 12.0–16.0)
MCH: 26.1 pg (ref 26.0–34.0)
MCHC: 32.8 g/dL (ref 32.0–36.0)
MCV: 79.6 fL — ABNORMAL LOW (ref 80.0–100.0)
Platelets: 327 10*3/uL (ref 150–440)
RBC: 4.73 MIL/uL (ref 3.80–5.20)
RDW: 18.9 % — ABNORMAL HIGH (ref 11.5–14.5)
WBC: 5.9 10*3/uL (ref 3.6–11.0)

## 2016-08-01 LAB — HEPARIN LEVEL (UNFRACTIONATED): Heparin Unfractionated: 0.79 IU/mL — ABNORMAL HIGH (ref 0.30–0.70)

## 2016-08-01 MED ORDER — DULOXETINE HCL 30 MG PO CPEP: 30.0000 mg | ORAL_CAPSULE | Freq: Every day | ORAL | 3 refills | Status: AC

## 2016-08-01 MED ORDER — WARFARIN SODIUM 1 MG PO TABS: 3.0000 mg | ORAL_TABLET | Freq: Every day | ORAL | Status: AC

## 2016-08-01 MED ORDER — FLUCONAZOLE 200 MG PO TABS: 400.0000 mg | ORAL_TABLET | Freq: Every day | ORAL | Status: AC

## 2016-08-01 NOTE — Care Management (Signed)
Post discharge 08/01/16: Received call from patient's Husband Rachel Morrison [phone] requesting that I help his wife [facility] [phone]. [facility] will call him with appointment time.

## 2016-08-01 NOTE — Care Management (Signed)
This CM reviewed patient record and anticipated [facility] referral and to discuss how would follow up on protimes.  While CM was gathering resources and plan, patient discharged and has left the unit.  Informed that patient  had did not have issues obtaining her medications and had a supply of coumadin in the home. Has follow up appointment with Dr Juliann Paresallwood

## 2016-08-01 NOTE — Progress Notes (Signed)
Discharge instructions explained to pt and pts spouse/ verbalized an understanding/ iv removed/ RX  Given to pt/ transported off unit via wheelchair.

## 2016-08-01 NOTE — Progress Notes (Signed)
ANTICOAGULATION CONSULT NOTE - Follow up Consult  Pharmacy Consult for heparin drip Indication: mechanical mitral valve  No Known Allergies  Patient Measurements: Height: 5\' 4"  (162.6 cm) Weight: 119 lb 6.4 oz (54.2 kg) IBW/kg (Calculated) : 54.7 Heparin Dosing Weight: 54.2 kg  Vital Signs: Temp: 97.5 F (36.4 C) (08/18 0431) Temp Source: Oral (08/18 0431) BP: 148/89 (08/18 0900) Pulse Rate: 82 (08/18 0900)  Labs:  Recent Labs  07/30/16 0712 07/31/16 0456 08/01/16 0926  HGB 10.6* 10.3* 12.4  HCT 32.3* 30.8* 37.7  PLT 328 308 327  LABPROT 18.6* 21.3* 30.6*  INR 1.54 1.82 2.86  HEPARINUNFRC 0.42 0.42 0.79*    Estimated Creatinine Clearance: 75.2 mL/min (by C-G formula based on SCr of 0.8 mg/dL).   Medical History: Past Medical History:  Diagnosis Date  . Anxiety   . H/O endocarditis   . H/O mitral valve replacement   . HLD (hyperlipidemia)     Medications:  On warfarin as outpatient.   Assessment: INR subtherapeutic on admission.   Goal of Therapy:  Heparin level 0.3-0.7 units/ml Monitor platelets by anticoagulation protocol: Yes   Plan:  Current orders for heparin 850 units/hr Heparin level= 0.79, supratherapeutic. INR therapeutic at 2.86  Per RN patient discharging, heparin already turned off. No adjustment needed.   INR trending up, recommend close follow-up of INR upon discharge due to interaction with fluconazole.  Rayel Santizo C, Pharm.D., BCPS Clinical Pharmacist 08/01/2016,10:24 AM

## 2016-08-01 NOTE — Discharge Instructions (Signed)

## 2016-08-01 NOTE — Discharge Summary (Signed)
Rachel Morrison, 45 y.o., DOB 05-08-1971, MRN [id number]. 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 female who presents with Altered mental status and hallucinations. She was brought in by her significant other states that she tried to jump out of a car today because she saw some in the back seat that she thought was telling him to kill her. Patient does have a history of mitral valve mechanical replacement was on chronic anticoagulation her INR was subtherapeutic. Patient had a CT scan of the head in the ER which showed several new areas of seeming brain loss with ex vacuo changes. There was concern for possible new strokes therefore she was admitted for further evaluation and therapy. 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 Patient on secondary of hospitalization started becoming very agitated. And required multiple sedating medications. However they do not work therefore had to be transferred to the ICU. And had to be placed on a Precedex drip. She was seen by psychiatry who felt that her symptoms may be in some sort of withdrawal. After being on Precedex drip for 24-48 hours her symptoms resolve. She was also seen by neurology. They recommended no further therapy for her old strokes there were felt to be related to her endocarditis and mechanical valve with subtherapeutic INR. Patient was kept  on heparin until her INR was therapeutic. Her heparin is discontinued. She'll need close monitoring of her INR. She has no other symptoms and is feeling back to normal. Patient also had a TEE to rule out endocarditis which was negative. She was on antibiotics which have been discontinued. 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 She was also seen by infectious disease specialist.          Consults  cardiology and ID, psych,   Significant Tests:  See full reports for all details     Dg Chest 1 View  Result Date: 07/25/2016 CLINICAL DATA:  Altered mental status. Recent mitral valve replacement. EXAM: CHEST 1 VIEW COMPARISON:  None. FINDINGS: Sternotomy wires appear aligned and intact. Mitral valve prosthesis is in place. Normal heart size. Normal mediastinal contour. No pneumothorax. No pleural effusion. Lungs appear clear, with no acute consolidative airspace disease and no pulmonary edema. IMPRESSION: No active disease. Electronically Signed   By: Delbert PhenixJason A Poff M.D.   On: 07/25/2016 18:40   Ct Head Wo Contrast  Result Date: 07/25/2016 CLINICAL DATA:  Altered mental status. Anticoagulated on Coumadin for recent mitral valve replacement. Confusion and hallucinations. EXAM: CT HEAD WITHOUT CONTRAST TECHNIQUE: Contiguous axial images were obtained from the base of the skull through the vertex without intravenous contrast. COMPARISON:  None. FINDINGS: There is a large focus of encephalomalacia in the medial right occipital lobe with associated ex vacuo dilatation of the occipital horn of the right lateral ventricle. There is a small focus of encephalomalacia in the left superior  cerebellar hemisphere. There is a hypoechoic focus in the right thalamus. No evidence of parenchymal hemorrhage or extra-axial fluid collection. No mass lesion, mass effect, or midline shift. Otherwise no ventriculomegaly. The visualized  paranasal sinuses are essentially clear. The mastoid air cells are unopacified. No evidence of calvarial fracture. IMPRESSION: 1. Large focus of encephalomalacia in the medial right occipital lobe with ex vacuo dilatation of the occipital horn of the right lateral ventricle, most suggestive of a chronic infarct. 2. Small focus of encephalomalacia in the left superior cerebellar hemisphere, most suggestive of a late subacute to chronic infarct. 3. Hypoechoic focus in the right thalamus, suggesting an infarct of uncertain chronicity, probably subacute to chronic. Correlate with brain MRI as clinically warranted. 4. No acute intracranial hemorrhage. No significant mass effect or midline shift. Electronically Signed   By: Delbert PhenixJason A Poff M.D.   On: 07/25/2016 18:50   Ct Head W & Wo Contrast  Result Date: 07/29/2016 CLINICAL DATA:  Altered mental status.  Hallucinations. EXAM: CT HEAD WITHOUT AND WITH CONTRAST TECHNIQUE: Contiguous axial images were obtained from the base of the skull through the vertex without and with intravenous contrast CONTRAST:  75 mL Isovue-300 COMPARISON:  Noncontrast head CT 07/25/2016 FINDINGS: Brain: A chronic right PCA territory infarct is again seen involving the right occipital lobe. Well-defined hypodensity in the right thalamus is unchanged and also consistent with a chronic posterior circulation infarct. A chronic superior left cerebellar infarct is again noted. The ventricles are normal in size aside from ex vacuo dilatation of the atrium and occipital horn on the right related to the chronic infarct. There is no evidence of acute cortical infarct, intracranial hemorrhage, mass, midline shift, or extra-axial fluid collection. No abnormal enhancement is identified. Vascular: No hyperdense vessel or unexpected calcification. Skull: No acute osseous abnormality. Sinuses/Orbits: Visualized paranasal sinuses and mastoid air cells are clear. Visualized orbits are unremarkable. IMPRESSION: 1.  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 2. No evidence of acute intracranial abnormality. No abnormal enhancement. Electronically Signed   By: Sebastian AcheAllen  Grady M.D.   On: 07/29/2016 14:26       Today   Subjective:   Rachel Morrison  patient feeling well denies any complaints  Objective:   Blood pressure (!) 148/89, pulse 82, temperature 97.5 F (36.4 C), temperature source Oral, resp. rate 18, height 5\' 4"  (1.626 m), weight 54.2 kg (119 lb 6.4 oz), last menstrual period 07/09/2016, SpO2 100 %.  .  Intake/Output Summary (Last 24 hours) at 08/01/16 1254 Last data filed at 08/01/16 0943  Gross per 24 hour  Intake              308 ml  Output             2650 ml  Net            -2342 ml    Exam VITAL SIGNS: Blood pressure (!) 148/89, pulse 82, temperature 97.5 F (36.4 C), temperature source Oral, resp. rate 18, height 5\' 4"  (1.626 m), weight 54.2 kg (119 lb 6.4 oz), last menstrual period 07/09/2016, SpO2 100 %.  GENERAL:  45 y.o.-year-old patient lying in the bed with no acute distress.  EYES: Pupils equal, round, reactive to light and accommodation. No scleral icterus. Extraocular muscles intact.  HEENT: Head atraumatic, normocephalic. Oropharynx and nasopharynx clear.  NECK:  Supple, no jugular venous distention. No thyroid enlargement, no tenderness.  LUNGS: Normal breath sounds bilaterally, no wheezing, rales,rhonchi or  crepitation. No use of accessory muscles of respiration.  CARDIOVASCULAR: S1, S2 normal. No murmurs, rubs, or gallops.  ABDOMEN: Soft, nontender, nondistended. Bowel sounds present. No organomegaly or mass.  EXTREMITIES: No pedal edema, cyanosis, or clubbing.  NEUROLOGIC: Cranial nerves II through XII are intact. Muscle strength 5/5 in all extremities. Sensation intact. Gait not checked.  PSYCHIATRIC: The patient is alert and oriented x 3.  SKIN: No obvious rash, lesion, or ulcer.   Data Review      CBC w Diff: Lab Results  Component Value Date   WBC 5.9 08/01/2016   HGB 12.4 08/01/2016   HCT 37.7 08/01/2016   PLT 327 08/01/2016   LYMPHOPCT 37 07/25/2016   MONOPCT 10 07/25/2016   EOSPCT 1 07/25/2016   BASOPCT 1 07/25/2016   CMP: Lab Results  Component Value Date   NA 138 07/29/2016   K 4.5 07/29/2016   CL 111 07/29/2016   CO2 22 07/29/2016   BUN 10 07/29/2016   CREATININE 0.79 07/29/2016   PROT 8.2 (H) 07/25/2016   ALBUMIN 4.2 07/25/2016   BILITOT 0.5 07/25/2016   ALKPHOS 109 07/25/2016   AST 52 (H) 07/25/2016   ALT 28 07/25/2016  .  Micro Results Recent Results (from the past 240 hour(s))  Blood culture (routine x 2)     Status: None   Collection Time: 07/25/16  7:01 PM  Result Value Ref Range Status   Specimen Description BLOOD LEFT ARM  Final   Special Requests BOTTLES DRAWN AEROBIC AND ANAEROBIC 7CCAERO,7CCANA  Final   Culture NO GROWTH 5 DAYS  Final   Report Status 07/30/2016 FINAL  Final  Blood culture (routine x 2)     Status: None   Collection Time: 07/25/16  7:01 PM  Result Value Ref Range Status   Specimen Description BLOOD LEFT ASSIST CONTROL  Final   Special Requests BOTTLES DRAWN AEROBIC AND ANAEROBIC 7CCAERO,5CCANA  Final   Culture NO GROWTH 5 DAYS  Final   Report Status 07/30/2016 FINAL  Final  Urine culture     Status: Abnormal   Collection Time: 07/25/16  7:10 PM  Result Value Ref Range Status   Specimen Description URINE, RANDOM  Final   Special Requests NONE  Final   Culture MULTIPLE SPECIES PRESENT, SUGGEST RECOLLECTION (A)  Final   Report Status 07/27/2016 FINAL  Final  Blood culture (single)     Status: None   Collection Time: 07/25/16  8:34 PM  Result Value Ref Range Status   Specimen Description BLOOD LEFT FOREARM  Final   Special Requests BOTTLES DRAWN AEROBIC AND ANAEROBIC 9CCAERO,8CCANA  Final   Culture NO GROWTH 5 DAYS  Final   Report Status 07/30/2016 FINAL  Final  MRSA PCR Screening     Status: None   Collection  Time: 07/26/16 10:31 PM  Result Value Ref Range Status   MRSA by PCR NEGATIVE NEGATIVE Final    Comment:        The GeneXpert MRSA Assay (FDA approved for NASAL specimens only), is one component of a comprehensive MRSA colonization surveillance program. It is not intended to diagnose MRSA infection nor to guide or monitor treatment for MRSA infections.         Code Status Orders        Start     Ordered   07/25/16 2320  Full code  Continuous     07/25/16 2319    Code Status History    Date Active Date Inactive Code Status Order  ID Comments User Context   07/25/2016 11:19 PM 07/27/2016  9:35 AM Full Code [id number]  Oralia Manis, MD Inpatient          Follow-up Information    Alwyn Pea., MD. Go on 08/04/2016.   Specialties:  Cardiology, Internal Medicine Why:  Time:2:30 p.m. inr check was previously seen there Contact information: [address] [facility] - CARDIOLOGY [place] [address] [phone]        Mick Sell, MD Follow up in 3 week(s).   Specialty:  Infectious Diseases Why:  The office will call you at home with an appoinment Contact information: [address] [facility] - INFECTIOUS DISEASE [place] [address] [phone]        [facility] Follow up in 2 week(s).   Specialty:  Primary Care Contact information: [address] [place] [address] [place] [address] [phone]          Discharge Medications     Medication List    STOP taking these medications   enoxaparin 60 MG/0.6ML injection Commonly known as:  LOVENOX     TAKE these medications   ALPRAZolam 1 MG tablet Commonly known as:  XANAX Take 1 tablet by mouth at bedtime as needed.   DULoxetine 30 MG capsule Commonly known as:  CYMBALTA Take 1 capsule (30 mg total) by mouth daily.   fluconazole 200 MG tablet Commonly known as:  DIFLUCAN Take 2 tablets (400 mg total) by  mouth daily.   warfarin 1 MG tablet Commonly known as:  COUMADIN Take 3 tablets (3 mg total) by mouth daily at 6 PM. What changed:  medication strength  how much to take  when to take this          Total Time in preparing paper work, data evaluation and todays exam - 35 minutes  Auburn Bilberry M.D on 08/01/2016 at 12:54 PM  [facility]   Office  [phone]

## 2016-08-15 ENCOUNTER — Other Ambulatory Visit
Admission: RE | Admit: 2016-08-15 | Discharge: 2016-08-15 | Disposition: A | Discharge status: Home / Self Care | Payer: Self-pay | Source: Ambulatory Visit | Attending: Internal Medicine | Admitting: Internal Medicine

## 2016-08-15 DIAGNOSIS — Z952 Presence of prosthetic heart valve: Secondary | ICD-10-CM | POA: Insufficient documentation

## 2016-08-15 LAB — PROTIME-INR: Prothrombin Time: >90 seconds — ABNORMAL HIGH (ref 11.4–15.2)

## 2017-01-15 DEATH — deceased

## 2017-02-08 IMAGING — CT CT HEAD WO/W CM
3 of 4 series · 15 of 47 positions shown, 18 images · IV contrast (agent unspecified)
Comparison: Noncontrast head CT 07/25/2016

CLINICAL DATA: Altered mental status.  Hallucinations.

EXAM:
CT HEAD WITHOUT AND WITH CONTRAST
TECHNIQUE: Contiguous axial images were obtained from the base of the skull
through the vertex without and with intravenous contrast
CONTRAST:  75 mL Qsovue-W00

[Series 2: head wo · axial · 0.43mm/px · z∈[-177,-57]mm · 9 of 30 slices shown, 12 images]
[im 3/30  brain]
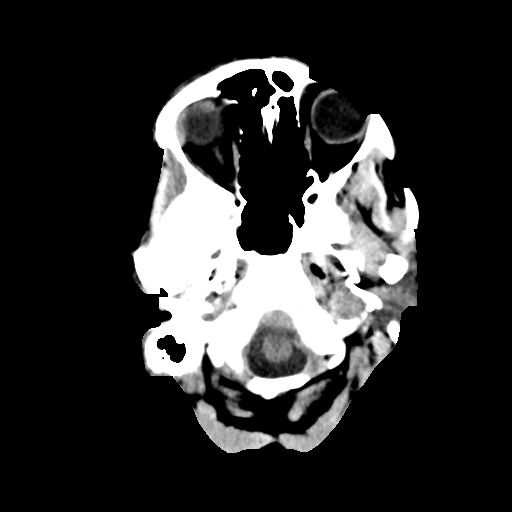
[im 3/30  bone]
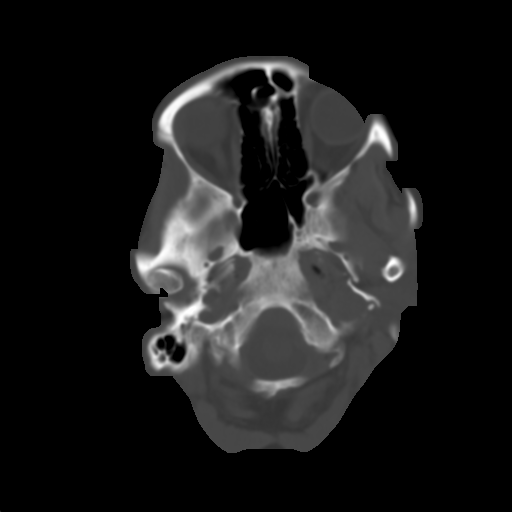
[im 7/30  brain]
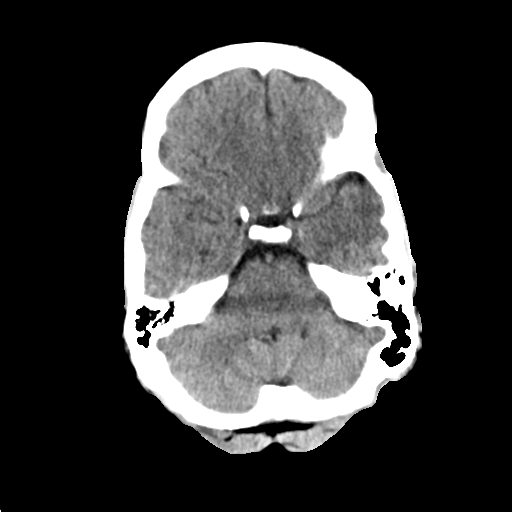
[im 9/30  brain]
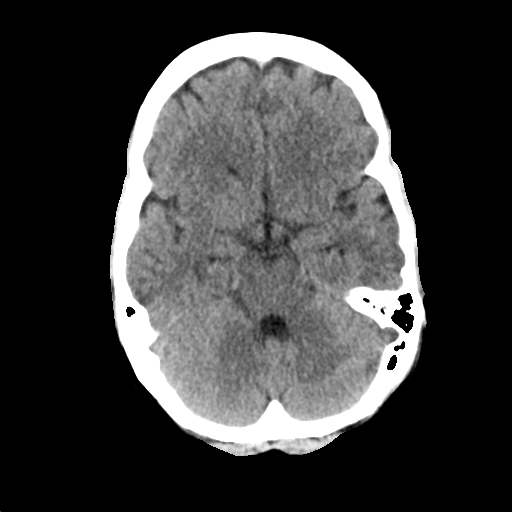
[im 13/30  brain]
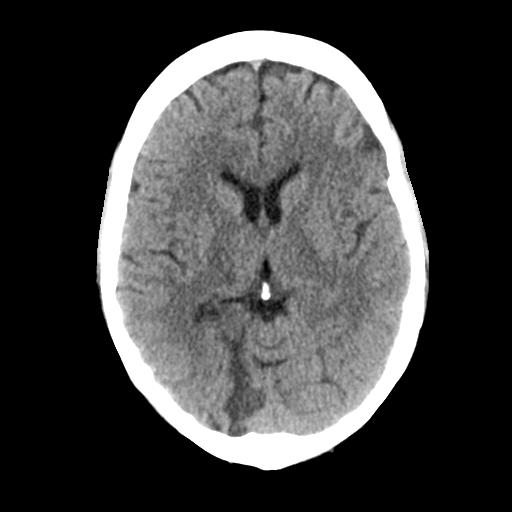
[im 15/30  brain]
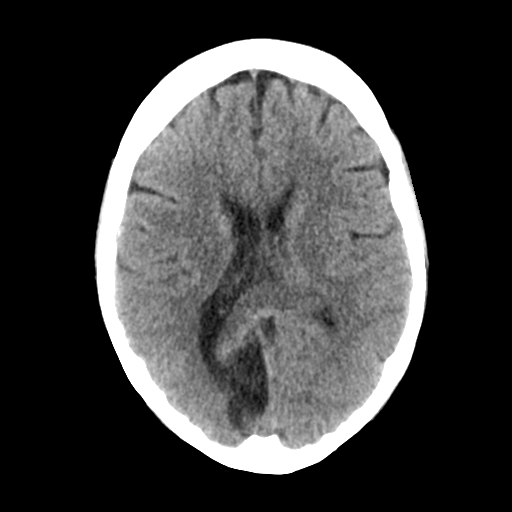
[im 15/30  bone]
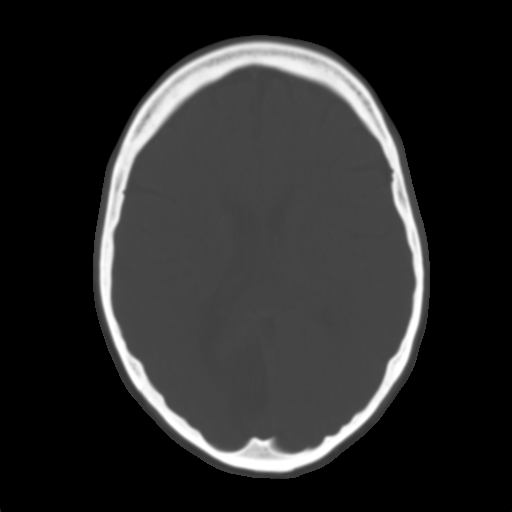
[im 17/30  brain]
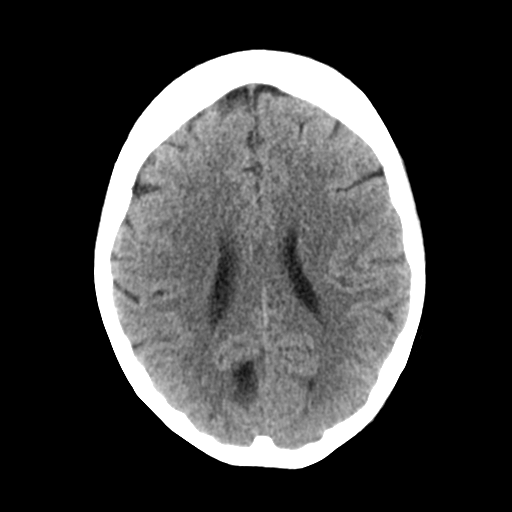
[im 21/30  brain]
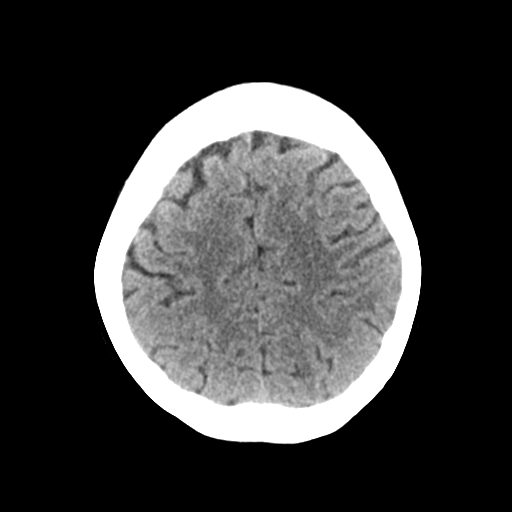
[im 23/30  brain]
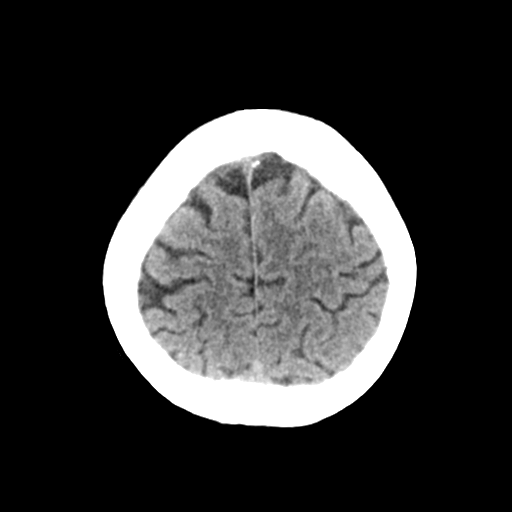
[im 27/30  brain]
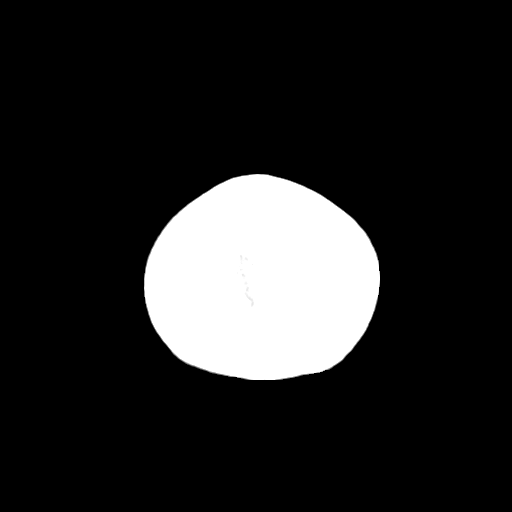
[im 27/30  bone]
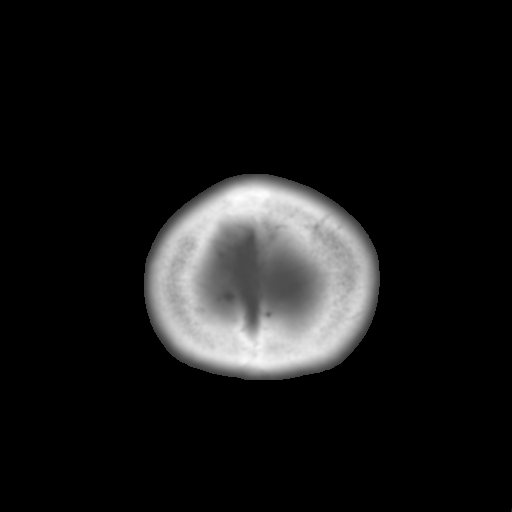

[Series 5: coronal soft tissue · coronal · 0.29mm/px · 3 of 67 slices shown]
[im 23/67  brain]
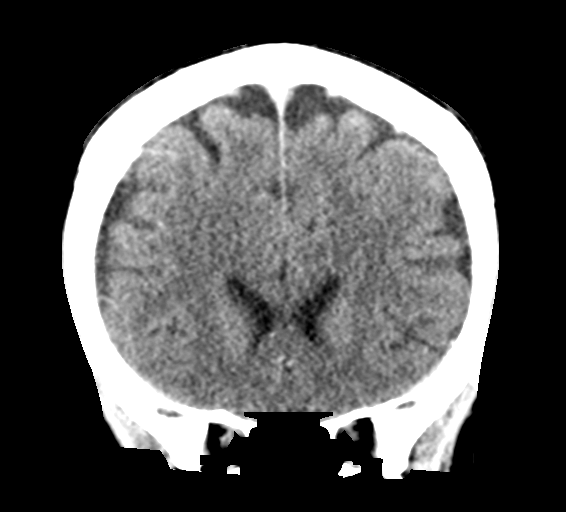
[im 30/67  brain]
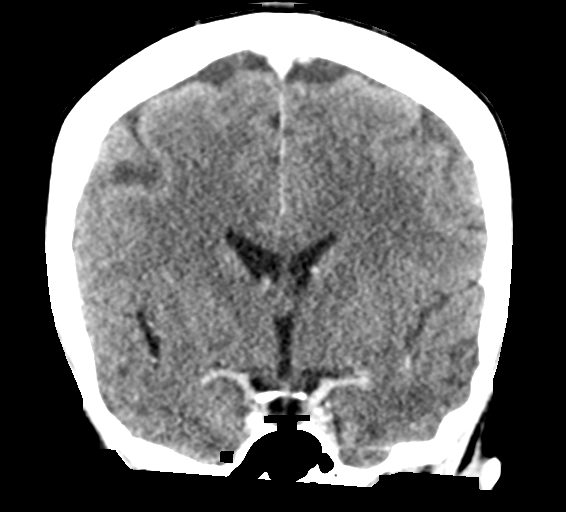
[im 37/67  brain]
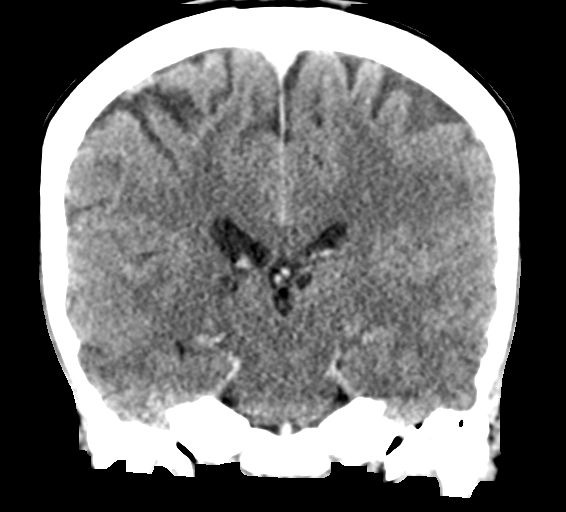

[Series 6: sagittal soft tissue · sagittal · 0.31mm/px · 3 of 50 slices shown]
[im 17/50  brain]
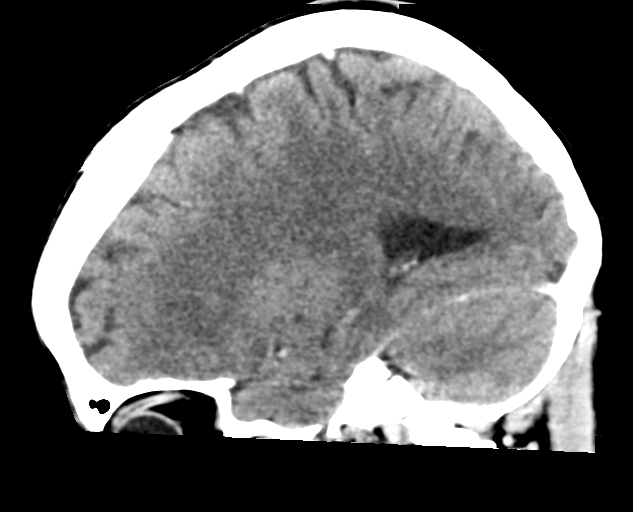
[im 25/50  brain]
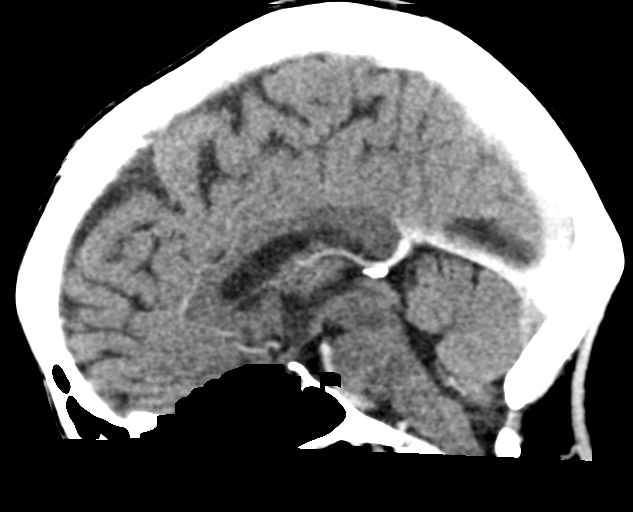
[im 33/50  brain]
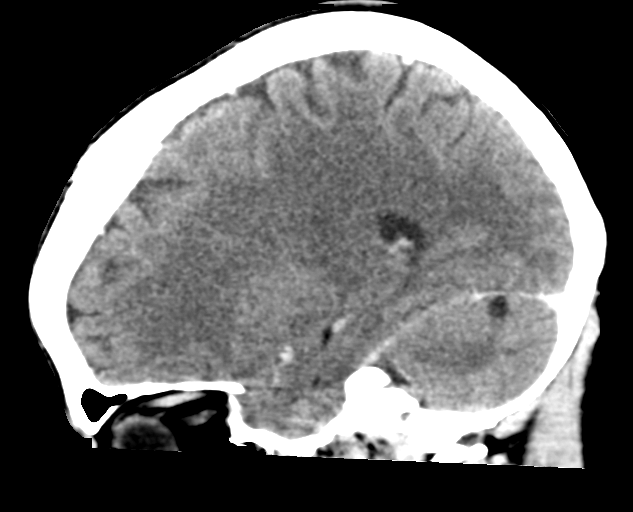

[15 of 47 positions shown; findings below may reference images not displayed]

FINDINGS: Brain: A chronic right PCA territory infarct is again seen involving
the right occipital lobe. Well-defined hypodensity in the right
thalamus is unchanged and also consistent with a chronic posterior
circulation infarct. A chronic superior left cerebellar infarct is
again noted. The ventricles are normal in size aside from ex vacuo
dilatation of the atrium and occipital horn on the right related to
the chronic infarct. There is no evidence of acute cortical infarct,
intracranial hemorrhage, mass, midline shift, or extra-axial fluid
collection. No abnormal enhancement is identified.

Vascular: No hyperdense vessel or unexpected calcification.

Skull: No acute osseous abnormality.

Sinuses/Orbits: Visualized paranasal sinuses and mastoid air cells
are clear. Visualized orbits are unremarkable.
IMPRESSION: 1. Chronic posterior circulation infarcts involving the right
occipital lobe, right thalamus, and left cerebellum.
2. No evidence of acute intracranial abnormality. No abnormal
enhancement.
